# Patient Record
Sex: Female | Born: 1975
Health system: Southern US, Community
[De-identification: ages and names within clinical notes are randomized; demographics above are authoritative.]

---

## 2017-07-06 ENCOUNTER — Ambulatory Visit (INDEPENDENT_AMBULATORY_CARE_PROVIDER_SITE_OTHER): Payer: 59 | Admitting: Family Medicine

## 2017-07-06 ENCOUNTER — Encounter: Payer: Self-pay | Admitting: Family Medicine

## 2017-07-06 VITALS — BP 112/62 | HR 104 | Temp 98.0°F | Ht 65.5 in | Wt 132.0 lb

## 2017-07-06 DIAGNOSIS — Z13220 Encounter for screening for lipoid disorders: Secondary | ICD-10-CM | POA: Diagnosis not present

## 2017-07-06 DIAGNOSIS — Z0000 Encounter for general adult medical examination without abnormal findings: Secondary | ICD-10-CM | POA: Diagnosis not present

## 2017-07-06 LAB — LIPID PANEL
CHOL/HDL RATIO: 2
Cholesterol: 132 mg/dL (ref 0–200)
HDL: 61.5 mg/dL (ref >39.00)
LDL CALC: 64 mg/dL (ref 0–99)
NonHDL: 70.6
TRIGLYCERIDES: 32 mg/dL (ref 0.0–149.0)
VLDL: 6.4 mg/dL (ref 0.0–40.0)

## 2017-07-06 LAB — BASIC METABOLIC PANEL
BUN: 17 mg/dL (ref 6–23)
CALCIUM: 9.7 mg/dL (ref 8.4–10.5)
CO2: 31 mEq/L (ref 19–32)
Chloride: 102 mEq/L (ref 96–112)
Creatinine, Ser: 0.63 mg/dL (ref 0.40–1.20)
GFR: 110.28 mL/min (ref >60.00)
GLUCOSE: 91 mg/dL (ref 70–99)
POTASSIUM: 3.7 meq/L (ref 3.5–5.1)
SODIUM: 139 meq/L (ref 135–145)

## 2017-07-06 LAB — CBC
HEMATOCRIT: 39 % (ref 36.0–46.0)
Hemoglobin: 12.9 g/dL (ref 12.0–15.0)
MCHC: 33.2 g/dL (ref 30.0–36.0)
MCV: 88.1 fl (ref 78.0–100.0)
Platelets: 220 10*3/uL (ref 150.0–400.0)
RBC: 4.42 Mil/uL (ref 3.87–5.11)
RDW: 13 % (ref 11.5–15.5)
WBC: 5.8 10*3/uL (ref 4.0–10.5)

## 2017-07-06 NOTE — Patient Instructions (Addendum)
Preventive Care 40-64 Years, Female Preventive care refers to lifestyle choices and visits with your health care provider that can promote health and wellness. What does preventive care include?  A yearly physical exam. This is also called an annual well check.  Dental exams once or twice a year.  Routine eye exams. Ask your health care provider how often you should have your eyes checked.  Personal lifestyle choices, including: ? Daily care of your teeth and gums. ? Regular physical activity. ? Eating a healthy diet. ? Avoiding tobacco and drug use. ? Limiting alcohol use. ? Practicing safe sex. ? Taking low-dose aspirin daily starting at age 58. ? Taking vitamin and mineral supplements as recommended by your health care provider. What happens during an annual well check? The services and screenings done by your health care provider during your annual well check will depend on your age, overall health, lifestyle risk factors, and family history of disease. Counseling Your health care provider may ask you questions about your:  Alcohol use.  Tobacco use.  Drug use.  Emotional well-being.  Home and relationship well-being.  Sexual activity.  Eating habits.  Work and work Statistician.  Method of birth control.  Menstrual cycle.  Pregnancy history.  Screening You may have the following tests or measurements:  Height, weight, and BMI.  Blood pressure.  Lipid and cholesterol levels. These may be checked every 5 years, or more frequently if you are over 81 years old.  Skin check.  Lung cancer screening. You may have this screening every year starting at age 78 if you have a 30-pack-year history of smoking and currently smoke or have quit within the past 15 years.  Fecal occult blood test (FOBT) of the stool. You may have this test every year starting at age 65.  Flexible sigmoidoscopy or colonoscopy. You may have a sigmoidoscopy every 5 years or a colonoscopy  every 10 years starting at age 30.  Hepatitis C blood test.  Hepatitis B blood test.  Sexually transmitted disease (STD) testing.  Diabetes screening. This is done by checking your blood sugar (glucose) after you have not eaten for a while (fasting). You may have this done every 1-3 years.  Mammogram. This may be done every 1-2 years. Talk to your health care provider about when you should start having regular mammograms. This may depend on whether you have a family history of breast cancer.  BRCA-related cancer screening. This may be done if you have a family history of breast, ovarian, tubal, or peritoneal cancers.  Pelvic exam and Pap test. This may be done every 3 years starting at age 80. Starting at age 36, this may be done every 5 years if you have a Pap test in combination with an HPV test.  Bone density scan. This is done to screen for osteoporosis. You may have this scan if you are at high risk for osteoporosis.  Discuss your test results, treatment options, and if necessary, the need for more tests with your health care provider. Vaccines Your health care provider may recommend certain vaccines, such as:  Influenza vaccine. This is recommended every year.  Tetanus, diphtheria, and acellular pertussis (Tdap, Td) vaccine. You may need a Td booster every 10 years.  Varicella vaccine. You may need this if you have not been vaccinated.  Zoster vaccine. You may need this after age 5.  Measles, mumps, and rubella (MMR) vaccine. You may need at least one dose of MMR if you were born in  1957 or later. You may also need a second dose.  Pneumococcal 13-valent conjugate (PCV13) vaccine. You may need this if you have certain conditions and were not previously vaccinated.  Pneumococcal polysaccharide (PPSV23) vaccine. You may need one or two doses if you smoke cigarettes or if you have certain conditions.  Meningococcal vaccine. You may need this if you have certain  conditions.  Hepatitis A vaccine. You may need this if you have certain conditions or if you travel or work in places where you may be exposed to hepatitis A.  Hepatitis B vaccine. You may need this if you have certain conditions or if you travel or work in places where you may be exposed to hepatitis B.  Haemophilus influenzae type b (Hib) vaccine. You may need this if you have certain conditions.  Talk to your health care provider about which screenings and vaccines you need and how often you need them. This information is not intended to replace advice given to you by your health care provider. Make sure you discuss any questions you have with your health care provider. Document Released: 05/21/2015 Document Revised: 01/12/2016 Document Reviewed: 02/23/2015 Elsevier Interactive Patient Education  2018 [employer]  

## 2017-07-06 NOTE — Progress Notes (Signed)
Patient presents to clinic today to establish care.  SUBJECTIVE: PMH: Patient is a 42 year old female with no significant past medical history.  She was previously seen in [place] by Dr. Nile RiggsShapiro  Family planning: -Patient has a Mirena IUD in place. -It is due to be removed in 2021 -Patient has 1 daughter who is 2-1/42 yo.  Patient denies any issues during her pregnancy.  She states she did not even have nausea/vomiting. -Pt and her husband are considering having a second child. 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  Patient is interested in planning/catering/decorating.  Allergies: NKDA  Past surgical history: None  Social history: Patient is married.  She has a 372-1/22-year-old daughter.  Patient states her family relocated to the area for her husband's job.  Patient denies tobacco, alcohol, drug use.  Family medical history: Mom-hypertension Dad-heart problems had open heart surgery in 2014 Aunt-breast cancer in her 30s  Health Maintenance: Mammogram --due PAP --within the last few months (late 2018/early 2019)   History reviewed. No pertinent past medical history.  History reviewed. No pertinent surgical history.  No current outpatient medications on file prior to visit.   No current facility-administered medications on file prior to visit.     No Known Allergies  History reviewed. No pertinent family history.  Social History   Socioeconomic History  . Marital status: Married    Spouse name: Not on file  . Number of children: Not on file  . Years of education: Not on file  . Highest education level: Not on file  Social Needs  . Financial resource strain: Not on file  . Food insecurity - worry: Not on file  . Food insecurity - inability: Not on file  . Transportation needs - medical: Not on file  . Transportation needs - non-medical: Not on file  Occupational History  . Not on file  Tobacco Use  .  Smoking status: Not on file  Substance and Sexual Activity  . Alcohol use: Not on file  . Drug use: Not on file  . Sexual activity: Not on file  Other Topics Concern  . Not on file  Social History Narrative  . Not on file    ROS General: Denies fever, chills, night sweats, changes in weight, changes in appetite HEENT: Denies headaches, ear pain, changes in vision, rhinorrhea, sore throat CV: Denies CP, palpitations, SOB, orthopnea Pulm: Denies SOB, cough, wheezing GI: Denies abdominal pain, nausea, vomiting, diarrhea, constipation GU: Denies dysuria, hematuria, frequency, vaginal discharge Msk: Denies muscle cramps, joint pains Neuro: Denies weakness, numbness, tingling Skin: Denies rashes, bruising Psych: Denies depression, anxiety, hallucinations  BP 112/62 (BP Location: Left Arm, Patient Position: Sitting, Cuff Size: Normal)   Pulse (!) 104   Temp 98 F (36.7 C) (Oral)   Ht 5' 5.5" (1.664 m)   Wt 132 lb (59.9 kg)   SpO2 96%   BMI 21.63 kg/m   Physical Exam Gen. Pleasant, well developed, well-nourished, in NAD HEENT - [place]/AT, no scleral icterus, no nasal drainage, pharynx without erythema or exudate. Neck: No JVD, no thyromegaly, no carotid bruits Lungs: no use of accessory muscles, CTAB, no wheezes, rales or rhonchi Cardiovascular: RRR, No r/g/m, no peripheral edema Abdomen: BS present, soft, nontender,nondistended, no hepatosplenomegaly Musculoskeletal: No deformities, moves all four extremities, no cyanosis or clubbing, normal tone Neuro:  A&Ox3, CN II-XII intact, normal gait Skin:  Warm, dry, intact, no lesions Psych: normal affect, mood appropriate  No results found for this or any previous visit (from the past 2160 hour(s)).  Assessment/Plan: Well adult exam  -Anticipatory guidance given including wearing seatbelts, smoke detectors in the home, staying physically fit/active, increasing p.o. intake of water and vegetables. -Patient given handout -Pap  up-to-date -Given handout to schedule mammogram -We will obtain labs this visit -Mirena IUD in place.  Discussed removal decides to become pregnant prior to removal date. - Plan: CBC (no diff), Basic metabolic panel -Next CPE in 1 year  Screening for cholesterol level  - Plan: Lipid panel  Follow-up PRN  Abbe Amsterdam, MD

## 2017-07-09 ENCOUNTER — Encounter: Payer: Self-pay | Admitting: *Deleted

## 2017-07-16 ENCOUNTER — Encounter: Payer: Self-pay | Admitting: Family Medicine

## 2017-07-16 ENCOUNTER — Ambulatory Visit (INDEPENDENT_AMBULATORY_CARE_PROVIDER_SITE_OTHER): Payer: 59 | Admitting: Family Medicine

## 2017-07-16 VITALS — BP 118/62 | HR 127 | Temp 101.0°F | Ht 65.5 in | Wt 133.0 lb

## 2017-07-16 DIAGNOSIS — R000 Tachycardia, unspecified: Secondary | ICD-10-CM

## 2017-07-16 DIAGNOSIS — R509 Fever, unspecified: Secondary | ICD-10-CM | POA: Diagnosis not present

## 2017-07-16 DIAGNOSIS — J101 Influenza due to other identified influenza virus with other respiratory manifestations: Secondary | ICD-10-CM

## 2017-07-16 LAB — POCT INFLUENZA A/B
INFLUENZA A, POC: POSITIVE — AB
INFLUENZA B, POC: NEGATIVE

## 2017-07-16 MED ORDER — OSELTAMIVIR PHOSPHATE 75 MG PO CAPS: 75.0000 mg | ORAL_CAPSULE | Freq: Two times a day (BID) | ORAL | 0 refills | Status: AC

## 2017-07-16 NOTE — Progress Notes (Signed)
Subjective:    Patient ID: Cindy Todd, female    DOB: 12/22/1975, 42 y.o.   MRN: [id number]  Chief Complaint  Patient presents with  . Fever    HPI Patient was seen today for acute concern.  Pt endorses feeling weak, not sleeping, fever, body aches, slight headache, emesis, and cough which started Sunday.  Pt denies sore throat.  Pt states she has taken Tylenol and Robitussin for her symptoms.  Sick contacts include patient's neighbor who was sick over the weekend.  Pt did not get a flu shot this year.  Pt states her 42-year-old daughter has also been feeling sick so she has been trying to stay away from her.  She is not currently nursing given her illness.  Pt's husband is now beginning to feel sick as well.  History reviewed. No pertinent past medical history.  No Known Allergies  ROS General: Denies chills, night sweats, changes in weight, changes in appetite    +fever, body aches, feeling weak, insomnia HEENT: Denies ear pain, changes in vision, sore throat  + headache, rhinorrhea CV: Denies CP, palpitations, SOB, orthopnea Pulm: Denies SOB, wheezing  + cough GI: Denies abdominal pain, nausea, diarrhea, constipation  + vomiting GU: Denies dysuria, hematuria, frequency, vaginal discharge Msk: Denies muscle cramps, joint pains Neuro: Denies weakness, numbness, tingling Skin: Denies rashes, bruising Psych: Denies depression, anxiety, hallucinations     Objective:    Blood pressure 118/62, pulse (!) 127, temperature (!) 101 F (38.3 C), temperature source Oral, height 5' 5.5" (1.664 m), weight 133 lb (60.3 kg), SpO2 95 %.   Gen. Pleasant, well-nourished, in no distress, normal affect   HEENT: /AT, face symmetric,  no scleral icterus, PERRLA, nares patent with clear drainage, pharynx with mild erythema, no exudate.  TMs normal b/l, no cervical lymphadenopathy. Lungs: no accessory muscle use, CTAB, no wheezes or rales Cardiovascular: RRR, no m/r/g, no peripheral  edema Abdomen: BS present, soft, NT/ND Neuro:  A&Ox3, CN II-XII intact, normal gait   Wt Readings from Last 3 Encounters:  07/16/17 133 lb (60.3 kg)  07/06/17 132 lb (59.9 kg)    Lab Results  Component Value Date   WBC 5.8 07/06/2017   HGB 12.9 07/06/2017   HCT 39.0 07/06/2017   PLT 220.0 07/06/2017   GLUCOSE 91 07/06/2017   CHOL 132 07/06/2017   TRIG 32.0 07/06/2017   HDL 61.50 07/06/2017   LDLCALC 64 07/06/2017   NA 139 07/06/2017   K 3.7 07/06/2017   CL 102 07/06/2017   CREATININE 0.63 07/06/2017   BUN 17 07/06/2017   CO2 31 07/06/2017    Assessment/Plan:  Influenza A  -Supportive care. -Discussed using Tamiflu given duration of symptoms less than 48 hours.  Discussed r/b/a. -Discussed frequent handwashing -Given handout - Plan: oseltamivir (TAMIFLU) 75 MG capsule  Fever, unspecified fever cause  - Plan: POCT Influenza A/B   positive  Tachycardia -Likely secondary to fever -Discussed staying hydrated, taking Tylenol as needed for aches, pains, and fever.  Given RTC or ED precautions.  Abbe AmsterdamShannon Dawson Hollman, MD

## 2017-07-16 NOTE — Patient Instructions (Addendum)
Remember to stay hydrated even if you do not feel like eating.  You can drink Gatorade, water, tea, juice, etc. It is okay to take Tylenol as needed for any aches, pains, or fever. I have sent in a prescription for Tamiflu to [facility] at the [facility] on [address] Influenza, Adult Influenza, more commonly known as "the flu," is a viral infection that primarily affects the respiratory tract. The respiratory tract includes organs that help you breathe, such as the lungs, nose, and throat. The flu causes many common cold symptoms, as well as a high fever and body aches. The flu spreads easily from person to person (is contagious). Getting a flu shot (influenza vaccination) every year is the best way to prevent influenza. What are the causes? Influenza is caused by a virus. You can catch the virus by:  Breathing in droplets from an infected person's cough or sneeze.  Touching something that was recently contaminated with the virus and then touching your mouth, nose, or eyes.  What increases the risk? The following factors may make you more likely to get the flu:  Not cleaning your hands frequently with soap and water or alcohol-based hand sanitizer.  Having close contact with many people during cold and flu season.  Touching your mouth, eyes, or nose without washing or sanitizing your hands first.  Not drinking enough fluids or not eating a healthy diet.  Not getting enough sleep or exercise.  Being under a high amount of stress.  Not getting a yearly (annual) flu shot.  You may be at a higher risk of complications from the flu, such as a severe lung infection (pneumonia), if you:  Are over the age of 42.  Are pregnant.  Have a weakened disease-fighting system (immune system). You may have a weakened immune system if you: ? Have HIV or AIDS. ? Are undergoing chemotherapy. ? Aretaking medicines that reduce the activity of (suppress) the immune system.  Have a long-term  (chronic) illness, such as heart disease, kidney disease, diabetes, or lung disease.  Have a liver disorder.  Are obese.  Have anemia.  What are the signs or symptoms? Symptoms of this condition typically last 4-10 days and may include:  Fever.  Chills.  Headache, body aches, or muscle aches.  Sore throat.  Cough.  Runny or congested nose.  Chest discomfort and cough.  Poor appetite.  Weakness or tiredness (fatigue).  Dizziness.  Nausea or vomiting.  How is this diagnosed? This condition may be diagnosed based on your medical history and a physical exam. Your health care provider may do a nose or throat swab test to confirm the diagnosis. How is this treated? If influenza is detected early, you can be treated with antiviral medicine that can reduce the length of your illness and the severity of your symptoms. This medicine may be given by mouth (orally) or through an IV tube that is inserted in one of your veins. The goal of treatment is to relieve symptoms by taking care of yourself at home. This may include taking over-the-counter medicines, drinking plenty of fluids, and adding humidity to the air in your home. In some cases, influenza goes away on its own. Severe influenza or complications from influenza may be treated in a hospital. Follow these instructions at home:  Take over-the-counter and prescription medicines only as told by your health care provider.  Use a cool mist humidifier to add humidity to the air in your home. This can make breathing easier.  Rest as needed.  Drink enough fluid to keep your urine clear or pale yellow.  Cover your mouth and nose when you cough or sneeze.  Wash your hands with soap and water often, especially after you cough or sneeze. If soap and water are not available, use hand sanitizer.  Stay home from work or school as told by your health care provider. Unless you are visiting your health care provider, try to avoid  leaving home until your fever has been gone for 24 hours without the use of medicine.  Keep all follow-up visits as told by your health care provider. This is important. How is this prevented?  Getting an annual flu shot is the best way to avoid getting the flu. You may get the flu shot in late summer, fall, or winter. Ask your health care provider when you should get your flu shot.  Wash your hands often or use hand sanitizer often.  Avoid contact with people who are sick during cold and flu season.  Eat a healthy diet, drink plenty of fluids, get enough sleep, and exercise regularly. Contact a health care provider if:  You develop new symptoms.  You have: ? Chest pain. ? Diarrhea. ? A fever.  Your cough gets worse.  You produce more mucus.  You feel nauseous or you vomit. Get help right away if:  You develop shortness of breath or difficulty breathing.  Your skin or nails turn a bluish color.  You have severe pain or stiffness in your neck.  You develop a sudden headache or sudden pain in your face or ear.  You cannot stop vomiting. This information is not intended to replace advice given to you by your health care provider. Make sure you discuss any questions you have with your health care provider. Document Released: 04/21/2000 Document Revised: 09/30/2015 Document Reviewed: 02/16/2015 Elsevier Interactive Patient Education  2017 [employer].

## 2017-07-27 ENCOUNTER — Encounter: Payer: Self-pay | Admitting: Family Medicine

## 2017-07-27 ENCOUNTER — Ambulatory Visit (INDEPENDENT_AMBULATORY_CARE_PROVIDER_SITE_OTHER): Payer: 59 | Admitting: Family Medicine

## 2017-07-27 VITALS — BP 100/80 | HR 84 | Temp 97.5°F | Wt 125.0 lb

## 2017-07-27 DIAGNOSIS — R05 Cough: Secondary | ICD-10-CM | POA: Diagnosis not present

## 2017-07-27 DIAGNOSIS — R0982 Postnasal drip: Secondary | ICD-10-CM

## 2017-07-27 DIAGNOSIS — R058 Other specified cough: Secondary | ICD-10-CM

## 2017-07-27 MED ORDER — FLUTICASONE PROPIONATE 50 MCG/ACT NA SUSP
1.0000 | Freq: Every day | NASAL | 0 refills | Status: DC
Start: 2017-07-27 — End: 2021-02-10

## 2017-07-27 MED ORDER — BENZONATATE 100 MG PO CAPS: 100.0000 mg | ORAL_CAPSULE | Freq: Two times a day (BID) | ORAL | 0 refills | Status: DC | PRN

## 2017-07-27 NOTE — Patient Instructions (Addendum)
Postnasal Drip Postnasal drip is the feeling of mucus going down the back of your throat. Mucus is a slimy substance that moistens and cleans your nose and throat, as well as the air pockets in face bones near your forehead and cheeks (sinuses). Small amounts of mucus pass from your nose and sinuses down the back of your throat all the time. This is normal. When you produce too much mucus or the mucus gets too thick, you can feel it. Some common causes of postnasal drip include:  Having more mucus because of: ? A cold or the flu. ? Allergies. ? Cold air. ? Certain medicines.  Having more mucus that is thicker because of: ? A sinus or nasal infection. ? Dry air. ? A food allergy.  Follow these instructions at home: Relieving discomfort  Gargle with a salt-water mixture 3-4 times a day or as needed. To make a salt-water mixture, completely dissolve -1 tsp of salt in 1 cup of warm water.  If the air in your home is dry, use a humidifier to add moisture to the air.  Use a saline spray or container (neti pot) to flush out the nose (nasal irrigation). These methods can help clear away mucus and keep the nasal passages moist. General instructions  Take over-the-counter and prescription medicines only as told by your health care provider.  Follow instructions from your health care provider about eating or drinking restrictions. You may need to avoid caffeine.  Avoid things that you know you are allergic to (allergens), like dust, mold, pollen, pets, or certain foods.  Drink enough fluid to keep your urine pale yellow.  Keep all follow-up visits as told by your health care provider. This is important. Contact a health care provider if:  You have a fever.  You have a sore throat.  You have difficulty swallowing.  You have headache.  You have sinus pain.  You have a cough that does not go away.  The mucus from your nose becomes thick and is green or yellow in color.  You have  cold or flu symptoms that last more than 10 days. Summary  Postnasal drip is the feeling of mucus going down the back of your throat.  If your health care provider approves, use nasal irrigation or a nasal spray 2?4 times a day.  Avoid things that you know you are allergic to (allergens), like dust, mold, pollen, pets, or certain foods. This information is not intended to replace advice given to you by your health care provider. Make sure you discuss any questions you have with your health care provider. Document Released: 08/07/2016 Document Revised: 08/07/2016 Document Reviewed: 08/07/2016 Elsevier Interactive Patient Education  2018 [employer].  Cough, Adult Coughing is a reflex that clears your throat and your airways. Coughing helps to heal and protect your lungs. It is normal to cough occasionally, but a cough that happens with other symptoms or lasts a long time may be a sign of a condition that needs treatment. A cough may last only 2-3 weeks (acute), or it may last longer than 8 weeks (chronic). What are the causes? Coughing is commonly caused by:  Breathing in substances that irritate your lungs.  A viral or bacterial respiratory infection.  Allergies.  Asthma.  Postnasal drip.  Smoking.  Acid backing up from the stomach into the esophagus (gastroesophageal reflux).  Certain medicines.  Chronic lung problems, including COPD (or rarely, lung cancer).  Other medical conditions such as heart failure.  Follow  these instructions at home: Pay attention to any changes in your symptoms. Take these actions to help with your discomfort:  Take medicines only as told by your health care provider. ? If you were prescribed an antibiotic medicine, take it as told by your health care provider. Do not stop taking the antibiotic even if you start to feel better. ? Talk with your health care provider before you take a cough suppressant medicine.  Drink enough fluid to keep your  urine clear or pale yellow.  If the air is dry, use a cold steam vaporizer or humidifier in your bedroom or your home to help loosen secretions.  Avoid anything that causes you to cough at work or at home.  If your cough is worse at night, try sleeping in a semi-upright position.  Avoid cigarette smoke. If you smoke, quit smoking. If you need help quitting, ask your health care provider.  Avoid caffeine.  Avoid alcohol.  Rest as needed.  Contact a health care provider if:  You have new symptoms.  You cough up pus.  Your cough does not get better after 2-3 weeks, or your cough gets worse.  You cannot control your cough with suppressant medicines and you are losing sleep.  You develop pain that is getting worse or pain that is not controlled with pain medicines.  You have a fever.  You have unexplained weight loss.  You have night sweats. Get help right away if:  You cough up blood.  You have difficulty breathing.  Your heartbeat is very fast. This information is not intended to replace advice given to you by your health care provider. Make sure you discuss any questions you have with your health care provider. Document Released: 10/21/2010 Document Revised: 09/30/2015 Document Reviewed: 07/01/2014 Elsevier Interactive Patient Education  2018 [employer] Weaning When you stop breastfeeding, it is called weaning. This can be a natural process that takes place on its own over time. There may be a reason you need to stop breastfeeding before it can happen naturally (such as you may need to go back to work, be away from home on a trip, or you feel it is the right time). If possible, wait until your baby is at least 276 months old. With a little time and preparation, weaning can be a positive experience. When is the best time to stop breastfeeding? There is no right or wrong answer. What is best for you and your child may be different from what is best for other mothers and  their children. It is recommended to:  Feed your baby only breast milk for the first 6 months.  Feed your baby both breast milk and solid food for another 6 months.  Continue to breastfeed your baby as long as both you and your child desire after 12 months.  How do I start weaning? Wean gradually over several weeks. Some guidelines to follow are:  Start by introducing iron-fortified, solid food in addition to breast milk.  Encourage your baby to try different feeding methods.  Teach your baby to drink from a cup. Try putting pre-pumped (expressed) breast milk in the cup.  If your baby will not use a cup, try offering a bottle.  It may be easier and less confusing for your baby if someone else offers the first cup or bottle feeding.  When cup or bottle feeding is successful, and your baby is getting enough nutrition that way, you can substitute a cup feeding for a breastfeeding  session.  You can eventually substitute a cup feeding for another breastfeeding session, especially if your baby will drink something besides your expressed breast milk.  Continue replacing one more breastfeeding session every few days.  Your breasts may feel full and uncomfortable at times during weaning. Express just a small amount of milk for relief.  How does breastfeeding stop naturally? Children may start to wean themselves at about 6 months. This may happen when:  You introduce solid food. Your baby may still prefer to nurse in order to get fluids.  Your baby is better able to drink from a cup. This may prompt your baby to breastfeed less often.  Your baby gradually becomes less interested in breastfeeding as he or she gets used to drinking other fluids.  Your baby slowly starts to drop one breastfeeding session every 2-3 days.  When should I get help with weaning? Your health care provider or a lactation consultant can help you during the weaning process. They are good resources to ask which  foods are best to introduce first and which fluids you can start substituting for breast milk. They can also help if you are having any problems related to weaning. When should I contact my health care provider? You should contact your health care provider if:  Your baby is not gaining weight.  You baby suddenly stops nursing.  One or both breasts become firm and painful.  This information is not intended to replace advice given to you by your health care provider. Make sure you discuss any questions you have with your health care provider. Document Released: 08/23/2004 Document Revised: 09/30/2015 Document Reviewed: 02/18/2013 Elsevier Interactive Patient Education  [employer].

## 2017-07-27 NOTE — Progress Notes (Signed)
Subjective:    Patient ID: Cindy Todd, female    DOB: 06/10/1975, 42 y.o.   MRN: [id number]  No chief complaint on file.   HPI Patient was seen today for ongoing concern.  Pt endorses feeling better since last OFV where she was diagnosed with influenza A on 07/16/17.  Pt states she still has a lingering nonproductive cough.  Pt endorses soreness in her chest with coughing.  Pt denies fever, rhinorrhea, headache, sore throat.  Pt also states she is trying to stop breast-feeding.  Pt's daughter is now 42 years old.  She is also trying to get her daughter to sleep in her own bed.  Patient states this has proved to be a challenge.  History reviewed. No pertinent past medical history.  No Known Allergies  ROS General: Denies fever, chills, night sweats, changes in weight, changes in appetite HEENT: Denies headaches, ear pain, changes in vision, rhinorrhea, sore throat CV: Denies CP, palpitations, SOB, orthopnea Pulm: Denies SOB, wheezing  +cough GI: Denies abdominal pain, nausea, vomiting, diarrhea, constipation GU: Denies dysuria, hematuria, frequency, vaginal discharge Msk: Denies muscle cramps, joint pains  +chest soreness with coughing. Neuro: Denies weakness, numbness, tingling Skin: Denies rashes, bruising Psych: Denies depression, anxiety, hallucinations     Objective:    Blood pressure 100/80, pulse 84, temperature (!) 97.5 F (36.4 C), temperature source Oral, weight 125 lb (56.7 kg), SpO2 98 %.   Gen. Pleasant, well-nourished, in no distress, normal affect   HEENT: [place]/AT, face symmetric, no scleral icterus, PERRLA, nares patent without drainage, pharynx without erythema or exudate, postnasal drainage present.  TMs full bilaterally.  No cervical lymphadenopathy. Lungs: no accessory muscle use, CTAB, no wheezes or rales Cardiovascular: RRR, no m/r/g, no peripheral edema Abdomen: BS present, soft, NT/ND Musculoskeletal: No deformities, no cyanosis or clubbing,  normal tone TTP of the left lateral chest and mid axillary line Neuro:  A&Ox3, CN II-XII intact, normal gait    Wt Readings from Last 3 Encounters:  07/27/17 125 lb (56.7 kg)  07/16/17 133 lb (60.3 kg)  07/06/17 132 lb (59.9 kg)    Lab Results  Component Value Date   WBC 5.8 07/06/2017   HGB 12.9 07/06/2017   HCT 39.0 07/06/2017   PLT 220.0 07/06/2017   GLUCOSE 91 07/06/2017   CHOL 132 07/06/2017   TRIG 32.0 07/06/2017   HDL 61.50 07/06/2017   LDLCALC 64 07/06/2017   NA 139 07/06/2017   K 3.7 07/06/2017   CL 102 07/06/2017   CREATININE 0.63 07/06/2017   BUN 17 07/06/2017   CO2 31 07/06/2017    Assessment/Plan:  Post-nasal drainage -Given handout - Plan: fluticasone (FLONASE) 50 MCG/ACT nasal spray  Post-viral cough syndrome -Patient given handout -Patient advised cough after viral syndrome can last several weeks. - Plan: benzonatate (TESSALON) 100 MG capsule  Follow-up PRN  Abbe AmsterdamShannon Dejae Bernet, MD

## 2018-01-04 ENCOUNTER — Encounter: Payer: Self-pay | Admitting: Family Medicine

## 2018-01-04 ENCOUNTER — Ambulatory Visit (INDEPENDENT_AMBULATORY_CARE_PROVIDER_SITE_OTHER): Payer: 59 | Admitting: Family Medicine

## 2018-01-04 VITALS — BP 122/60 | HR 114 | Temp 98.3°F | Wt 129.2 lb

## 2018-01-04 DIAGNOSIS — L255 Unspecified contact dermatitis due to plants, except food: Secondary | ICD-10-CM | POA: Diagnosis not present

## 2018-01-04 MED ORDER — TRIAMCINOLONE ACETONIDE 0.1 % EX CREA: 1.0000 "application " | TOPICAL_CREAM | Freq: Two times a day (BID) | CUTANEOUS | 0 refills | Status: DC | PRN

## 2018-01-04 NOTE — Progress Notes (Signed)
  Cindy HubertMeriann Yesenia Diaz-Gomez DOB: 12/19/1975 Encounter date: 01/04/2018  This is a 42 y.o. female who presents with Chief Complaint  Patient presents with  . Rash    both arms, x 4 days, itches, painful,     History of present illness:  Working in new yard and exposed to poison ivy. Has been using otc gold bond and sarna which helps with itch. Still bothering her. Started 4 days ago. At this point hasn't noticed any progression of rash. Mainly on arms, but some around ankles as well.     No Known Allergies Current Meds  Medication Sig  . benzonatate (TESSALON) 100 MG capsule Take 1 capsule (100 mg total) by mouth 2 (two) times daily as needed for cough.  . fluticasone (FLONASE) 50 MCG/ACT nasal spray Place 1 spray into both nostrils daily.    Review of Systems  Constitutional: Negative for chills and fever.  Skin: Positive for rash (see hpi).    Objective:  BP 122/60 (BP Location: Left Arm, Patient Position: Sitting, Cuff Size: Normal)   Pulse (!) 114   Temp 98.3 F (36.8 C) (Oral)   Wt 129 lb 3.2 oz (58.6 kg)   BMI 21.17 kg/m   Weight: 129 lb 3.2 oz (58.6 kg)   BP Readings from Last 3 Encounters:  01/04/18 122/60  07/27/17 100/80  07/16/17 118/62   Wt Readings from Last 3 Encounters:  01/04/18 129 lb 3.2 oz (58.6 kg)  07/27/17 125 lb (56.7 kg)  07/16/17 133 lb (60.3 kg)    Physical Exam  Constitutional: She appears well-developed and well-nourished. No distress.  Pulmonary/Chest: Effort normal.  Skin:  Vesicular rash in streaks up forearms; scattered around ankles.     Assessment/Plan 1. Plant dermatitis Apply triamcinolone as needed for itching/rash. Let us know if worsening of sx.  - triamcinolone cream (KENALOG) 0.1 %; Apply 1 application topically 2 (two) times daily as needed. Limit use to 2 weeks or less  Dispense: 15 g; Refill: 0  Return if symptoms worsen or fail to improve.    Theodis ShoveJunell Chelsia Serres, MD

## 2018-02-05 ENCOUNTER — Encounter: Payer: 59 | Admitting: Family Medicine

## 2018-02-05 ENCOUNTER — Encounter: Payer: Self-pay | Admitting: Family Medicine

## 2018-02-05 NOTE — Progress Notes (Signed)
Error.    Pt came in for pap, however had a pap Dec 2018/Jan 2019.  Mirena in place (to be removed 2021).  Last CPE was March 2019.   Influenza vaccine given Sept 2019 at pharmacy.   Of note pt and family moved into new house.  F/u prn  Abbe Amsterdam, MD

## 2018-05-16 ENCOUNTER — Ambulatory Visit (INDEPENDENT_AMBULATORY_CARE_PROVIDER_SITE_OTHER): Payer: No Typology Code available for payment source | Admitting: Family Medicine

## 2018-05-16 ENCOUNTER — Encounter: Payer: Self-pay | Admitting: Family Medicine

## 2018-05-16 VITALS — BP 102/74 | HR 80 | Temp 98.4°F | Wt 135.0 lb

## 2018-05-16 DIAGNOSIS — J018 Other acute sinusitis: Secondary | ICD-10-CM | POA: Diagnosis not present

## 2018-05-16 MED ORDER — AMOXICILLIN 500 MG PO CAPS: 500.0000 mg | ORAL_CAPSULE | Freq: Two times a day (BID) | ORAL | 0 refills | Status: AC

## 2018-05-16 NOTE — Progress Notes (Signed)
Subjective:    Patient ID: Cindy Todd, female    DOB: 1975/08/24, 43 y.o.   MRN: [id number]  No chief complaint on file.   HPI Patient was seen today for acute concern.  Pt dealing with cold symptoms times a few weeks.  Pt states she initially became sick with a cold, runny nose, headache, congestion which improved but then returned.  Pt endorses ongoing nasal congestion and pressure in her face.  Has taken Tylenol.  Sick contacts include pt's daughter who is in daycare.  Pt denies cough, sore throat, fever.  pt concerned about her ongoing illness as she is going to [place] to help take care of her sister who was recently dx'd with breast cancer.  Pt sister will be undergoing a double mastectomy.  influenza vaccine up-to-date History reviewed. No pertinent past medical history.  No Known Allergies  ROS General: Denies fever, chills, night sweats, changes in weight, changes in appetite HEENT: Denies headaches, ear pain, changes in vision, rhinorrhea, sore throat  +nasal congestion, facial pressure.   CV: Denies CP, palpitations, SOB, orthopnea Pulm: Denies SOB, cough, wheezing GI: Denies abdominal pain, nausea, vomiting, diarrhea, constipation GU: Denies dysuria, hematuria, frequency, vaginal discharge Msk: Denies muscle cramps, joint pains Neuro: Denies weakness, numbness, tingling Skin: Denies rashes, bruising Psych: Denies depression, anxiety, hallucinations    Objective:    Blood pressure 102/74, pulse 80, temperature 98.4 F (36.9 C), temperature source Oral, weight 135 lb (61.2 kg), SpO2 98 %.  Gen. Pleasant, well-nourished, in no distress, normal affect  HEENT: [place]/AT, face symmetric, no scleral icterus, PERRLA,  nares patent without drainage, pharynx without erythema or exudate. TMs full b/l.  No cervical lymphadenopathy. Lungs: no accessory muscle use, CTAB, no wheezes or rales Cardiovascular: RRR, no m/r/g, no peripheral edema Neuro:  A&Ox3, CN II-XII intact,  normal gait  Wt Readings from Last 3 Encounters:  05/16/18 135 lb (61.2 kg)  02/05/18 131 lb (59.4 kg)  01/04/18 129 lb 3.2 oz (58.6 kg)    Lab Results  Component Value Date   WBC 5.8 07/06/2017   HGB 12.9 07/06/2017   HCT 39.0 07/06/2017   PLT 220.0 07/06/2017   GLUCOSE 91 07/06/2017   CHOL 132 07/06/2017   TRIG 32.0 07/06/2017   HDL 61.50 07/06/2017   LDLCALC 64 07/06/2017   NA 139 07/06/2017   K 3.7 07/06/2017   CL 102 07/06/2017   CREATININE 0.63 07/06/2017   BUN 17 07/06/2017   CO2 31 07/06/2017    Assessment/Plan:  Acute non-recurrent sinusitis of other sinus  -Given handout -Okay to continue Tylenol PRN for pain/discomfort -Patient encouraged to use Flonase - Plan: amoxicillin (AMOXIL) 500 MG capsule  Follow-up PRN  Abbe Amsterdam, MD

## 2018-05-16 NOTE — Patient Instructions (Signed)
Sinusitis, en adultos  Sinusitis, Adult  La sinusitis es la inflamacin de los senos paranasales. Los senos paranasales son espacios vacos en los huesos alrededor del rostro. Los senos paranasales se encuentran en estos lugares:   Alrededor de los ojos.   En la mitad de la frente.   Detrs de la nariz.   En los pmulos.  Normalmente, la mucosidad drena a travs de los senos. Cuando los tejidos nasales se inflaman o hinchan, la mucosidad puede quedar atrapada o bloqueada. Esto fomenta la proliferacin de bacterias, virus y hongos, lo que produce infecciones. La mayora de las infecciones de los senos paranasales son provocadas por un virus.  La sinusitis puede desarrollarse rpidamente. Puede durar hasta 4semanas (aguda) o ms de 12semanas (crnica). A menudo, la sinusitis surge despus de un resfriado.  Cules son las causas?  Esta afeccin es causada por cualquier sustancia que inflame los senos o evite que la mucosidad drene. Esto puede comprender lo siguiente:   Alergias.   Asma.   Infeccin por bacterias o virus.   Deformidades u obstrucciones en la nariz o los senos paranasales.   Crecimientos anormales en la nariz (plipos nasales).   Agentes contaminantes, como sustancias qumicas o irritantes presentes en el aire.   Infeccin por hongos (poco frecuentes).  Qu incrementa el riesgo?  Es ms probable que tenga esta afeccin si:   Tienen debilitado el sistema de defensa del organismo (sistema inmunitario).   Nada o bucea mucho.   Abusa de los aerosoles nasales.   Fuma.  Cules son los signos o los sntomas?  Los principales sntomas de esta afeccin son dolor y sensacin de presin alrededor de los senos paranasales afectados. Otros sntomas pueden incluir los siguientes:   Nariz tapada o congestin nasal.   Drenaje de mucosidad espesa que sale de la nariz.   Hinchazn y calor en los senos paranasales afectados.   Dolor de cabeza.   Dolor en los dientes superiores.   Tos que puede  empeorar por la noche.   Mucosidad excesiva que se acumula en la garganta o la parte posterior de la nariz (goteo posnasal).   Disminucin del sentido del olfato y del gusto.   Fatiga.   Fiebre.   Dolor de garganta.   Mal aliento.  Cmo se diagnostica?  Esta afeccin se diagnostica en funcin de lo siguiente:   Sus sntomas.   Sus antecedentes mdicos.   Un examen fsico.   Pruebas para averiguar si la afeccin es aguda o crnica. Estas pueden incluir:  ? Revisar la nariz para ver si tiene plipos nasales.  ? Observar los senos paranasales con un dispositivo que tiene una luz (endoscopio).  ? Hacer pruebas para detectar alergias o bacterias.  ? Pruebas de diagnstico por imgenes, como resonancia magntica (RM) o una exploracin por tomografa computarizada (TC).  En contadas ocasiones, se puede realizar una biopsia de hueso para descartar tipos ms graves de infecciones por hongos en los senos paranasales.  Cmo se trata?  El tratamiento para la sinusitis depende de la causa y de si la afeccin es crnica o aguda.   Si la causa es un virus, los sntomas deberan desaparecer solos en el trmino de 10das. Pueden darle medicamentos para aliviar los sntomas. Entre ellos, se incluyen los siguientes:  ? Medicamentos para encoger las fosas nasales hinchadas (descongestivos intranasales tpicos).  ? Medicamentos para tratar alergias (antihistamnicos).  ? Un aerosol que alivia la inflamacin de las fosas nasales (corticoesteroide intranasal tpico).  ? Enjuagues que   ayudan a eliminar la mucosidad espesa de la nariz (lavados con solucin salina nasal).   Si la causa son bacterias, el mdico puede recomendarle que espere para ver si los sntomas mejoran. La mayora de las infecciones bacterianas mejoran sin medicamentos antibiticos. Posiblemente le den antibiticos si usted tiene:  ? Una infeccin grave.  ? El sistema inmunitario debilitado.   Si la causa es un estrechamiento de las fosas nasales o la  presencia de plipos nasales, es posible que necesite una ciruga.  Siga estas indicaciones en su casa:  Medicamentos   Tome, use o aplquese los medicamentos de venta libre y recetados solamente como se lo haya indicado el mdico. Estos pueden incluir aerosoles nasales.   Si le recetaron un antibitico, tmelo como se lo haya indicado el mdico. No deje de tomar los antibiticos aunque comience a sentirse mejor.  Hidrtese y humidifique los ambientes     Beba suficiente lquido como para mantener la orina de color amarillo plido. Mantenerse hidratado lo ayudar a diluir la mucosidad.   Use un humidificador de vapor fro para mantener la humedad de su hogar por encima del 50%.   Realice inhalaciones de vapor por 10 a 15minutos, de 3 a 4veces al da, o como se lo haya indicado el mdico. Puede hacer esto en el bao con el vapor del agua caliente de la ducha.   Limite la exposicin al aire fro o seco.  Reposo   Descanse todo lo que pueda.   Duerma con la cabeza levantada (elevada).   Asegrese de dormir lo suficiente cada noche.  Indicaciones generales     Aplquese un pao tibio y hmedo en la cara 3 o 4veces al da o como se lo haya indicado el mdico. Esto ayuda a calmar las molestias.   Lvese las manos frecuentemente con agua y jabn para reducir la exposicin a los grmenes. Use desinfectante para manos si no dispone de agua y jabn.   No fume. Evite estar cerca de personas que fuman (fumador pasivo).   Concurra a todas las visitas de seguimiento como se lo haya indicado el mdico. Esto es importante.  Comunquese con un mdico si:   Tiene fiebre.   Sus sntomas empeoran.   Los sntomas no mejoran en el trmino de 10das.  Solicite ayuda inmediatamente si:   Tiene un dolor de cabeza intenso.   Tiene vmitos persistentes.   Tiene dolor intenso o hinchazn en la zona del rostro o los ojos.   Tiene problemas de visin.   Presenta confusin.   Tiene el cuello rgido.   Tiene dificultad  para respirar.  Resumen   La sinusitis es el dolor y la inflamacin de los senos paranasales. Los senos paranasales son espacios vacos en los huesos alrededor del rostro.   La causa de esta afeccin es la inflamacin o hinchazn de los tejidos nasales. La hinchazn atrapa u obstruye el flujo de la mucosidad. Esto fomenta la proliferacin de bacterias, virus y hongos, lo que produce infecciones.   Si le recetaron un antibitico, tmelo como se lo haya indicado el mdico. No deje de tomar los antibiticos, aunque comience a sentirse mejor.   Concurra a todas las visitas de seguimiento como se lo haya indicado el mdico. Esto es importante.  Esta informacin no tiene como fin reemplazar el consejo del mdico. Asegrese de hacerle al mdico cualquier pregunta que tenga.  Document Released: 02/01/2005 Document Revised: 11/06/2017 Document Reviewed: 11/06/2017  Elsevier Interactive Patient Education  2019 [employer]

## 2018-06-06 ENCOUNTER — Encounter: Payer: No Typology Code available for payment source | Admitting: Family Medicine

## 2018-06-13 ENCOUNTER — Ambulatory Visit (INDEPENDENT_AMBULATORY_CARE_PROVIDER_SITE_OTHER): Payer: No Typology Code available for payment source | Admitting: Family Medicine

## 2018-06-13 ENCOUNTER — Encounter: Payer: Self-pay | Admitting: Family Medicine

## 2018-06-13 VITALS — BP 98/70 | HR 70 | Temp 98.7°F | Wt 137.0 lb

## 2018-06-13 DIAGNOSIS — Z0000 Encounter for general adult medical examination without abnormal findings: Secondary | ICD-10-CM | POA: Diagnosis not present

## 2018-06-13 DIAGNOSIS — Z13220 Encounter for screening for lipoid disorders: Secondary | ICD-10-CM

## 2018-06-13 DIAGNOSIS — Z131 Encounter for screening for diabetes mellitus: Secondary | ICD-10-CM

## 2018-06-13 LAB — CBC WITH DIFFERENTIAL/PLATELET
Basophils Absolute: 0 10*3/uL (ref 0.0–0.1)
Basophils Relative: 0.5 % (ref 0.0–3.0)
Eosinophils Absolute: 0.3 10*3/uL (ref 0.0–0.7)
Eosinophils Relative: 7.7 % — ABNORMAL HIGH (ref 0.0–5.0)
HCT: 38.1 % (ref 36.0–46.0)
HEMOGLOBIN: 12.8 g/dL (ref 12.0–15.0)
LYMPHS PCT: 40 % (ref 12.0–46.0)
Lymphs Abs: 1.5 10*3/uL (ref 0.7–4.0)
MCHC: 33.6 g/dL (ref 30.0–36.0)
MCV: 87.1 fl (ref 78.0–100.0)
MONO ABS: 0.2 10*3/uL (ref 0.1–1.0)
MONOS PCT: 6.3 % (ref 3.0–12.0)
Neutro Abs: 1.7 10*3/uL (ref 1.4–7.7)
Neutrophils Relative %: 45.5 % (ref 43.0–77.0)
Platelets: 190 10*3/uL (ref 150.0–400.0)
RBC: 4.37 Mil/uL (ref 3.87–5.11)
RDW: 13.3 % (ref 11.5–15.5)
WBC: 3.7 10*3/uL — ABNORMAL LOW (ref 4.0–10.5)

## 2018-06-13 LAB — BASIC METABOLIC PANEL
BUN: 14 mg/dL (ref 6–23)
CO2: 33 mEq/L — ABNORMAL HIGH (ref 19–32)
Calcium: 9.2 mg/dL (ref 8.4–10.5)
Chloride: 102 mEq/L (ref 96–112)
Creatinine, Ser: 0.61 mg/dL (ref 0.40–1.20)
GFR: 107.21 mL/min (ref >60.00)
Glucose, Bld: 84 mg/dL (ref 70–99)
Potassium: 4 mEq/L (ref 3.5–5.1)
Sodium: 139 mEq/L (ref 135–145)

## 2018-06-13 LAB — LIPID PANEL
Cholesterol: 151 mg/dL (ref 0–200)
HDL: 50.4 mg/dL (ref >39.00)
LDL Cholesterol: 86 mg/dL (ref 0–99)
NonHDL: 100.91
Total CHOL/HDL Ratio: 3
Triglycerides: 73 mg/dL (ref 0.0–149.0)
VLDL: 14.6 mg/dL (ref 0.0–40.0)

## 2018-06-13 LAB — HEMOGLOBIN A1C: HEMOGLOBIN A1C: 5.6 % (ref 4.6–6.5)

## 2018-06-13 NOTE — Patient Instructions (Signed)
Preventive Care 40-64 Years, Female Preventive care refers to lifestyle choices and visits with your health care provider that can promote health and wellness. What does preventive care include?   A yearly physical exam. This is also called an annual well check.  Dental exams once or twice a year.  Routine eye exams. Ask your health care provider how often you should have your eyes checked.  Personal lifestyle choices, including: ? Daily care of your teeth and gums. ? Regular physical activity. ? Eating a healthy diet. ? Avoiding tobacco and drug use. ? Limiting alcohol use. ? Practicing safe sex. ? Taking low-dose aspirin daily starting at age 50. ? Taking vitamin and mineral supplements as recommended by your health care provider. What happens during an annual well check? The services and screenings done by your health care provider during your annual well check will depend on your age, overall health, lifestyle risk factors, and family history of disease. Counseling Your health care provider may ask you questions about your:  Alcohol use.  Tobacco use.  Drug use.  Emotional well-being.  Home and relationship well-being.  Sexual activity.  Eating habits.  Work and work environment.  Method of birth control.  Menstrual cycle.  Pregnancy history. Screening You may have the following tests or measurements:  Height, weight, and BMI.  Blood pressure.  Lipid and cholesterol levels. These may be checked every 5 years, or more frequently if you are over 50 years old.  Skin check.  Lung cancer screening. You may have this screening every year starting at age 55 if you have a 30-pack-year history of smoking and currently smoke or have quit within the past 15 years.  Colorectal cancer screening. All adults should have this screening starting at age 50 and continuing until age 75. Your health care provider may recommend screening at age 45. You will have tests every  1-10 years, depending on your results and the type of screening test. People at increased risk should start screening at an earlier age. Screening tests may include: ? Guaiac-based fecal occult blood testing. ? Fecal immunochemical test (FIT). ? Stool DNA test. ? Virtual colonoscopy. ? Sigmoidoscopy. During this test, a flexible tube with a tiny camera (sigmoidoscope) is used to examine your rectum and lower colon. The sigmoidoscope is inserted through your anus into your rectum and lower colon. ? Colonoscopy. During this test, a long, thin, flexible tube with a tiny camera (colonoscope) is used to examine your entire colon and rectum.  Hepatitis C blood test.  Hepatitis B blood test.  Sexually transmitted disease (STD) testing.  Diabetes screening. This is done by checking your blood sugar (glucose) after you have not eaten for a while (fasting). You may have this done every 1-3 years.  Mammogram. This may be done every 1-2 years. Talk to your health care provider about when you should start having regular mammograms. This may depend on whether you have a family history of breast cancer.  BRCA-related cancer screening. This may be done if you have a family history of breast, ovarian, tubal, or peritoneal cancers.  Pelvic exam and Pap test. This may be done every 3 years starting at age 21. Starting at age 30, this may be done every 5 years if you have a Pap test in combination with an HPV test.  Bone density scan. This is done to screen for osteoporosis. You may have this scan if you are at high risk for osteoporosis. Discuss your test results, treatment options,   and if necessary, the need for more tests with your health care provider. Vaccines Your health care provider may recommend certain vaccines, such as:  Influenza vaccine. This is recommended every year.  Tetanus, diphtheria, and acellular pertussis (Tdap, Td) vaccine. You may need a Td booster every 10 years.  Varicella  vaccine. You may need this if you have not been vaccinated.  Zoster vaccine. You may need this after age 36.  Measles, mumps, and rubella (MMR) vaccine. You may need at least one dose of MMR if you were born in 1957 or later. You may also need a second dose.  Pneumococcal 13-valent conjugate (PCV13) vaccine. You may need this if you have certain conditions and were not previously vaccinated.  Pneumococcal polysaccharide (PPSV23) vaccine. You may need one or two doses if you smoke cigarettes or if you have certain conditions.  Meningococcal vaccine. You may need this if you have certain conditions.  Hepatitis A vaccine. You may need this if you have certain conditions or if you travel or work in places where you may be exposed to hepatitis A.  Hepatitis B vaccine. You may need this if you have certain conditions or if you travel or work in places where you may be exposed to hepatitis B.  Haemophilus influenzae type b (Hib) vaccine. You may need this if you have certain conditions. Talk to your health care provider about which screenings and vaccines you need and how often you need them. This information is not intended to replace advice given to you by your health care provider. Make sure you discuss any questions you have with your health care provider. Document Released: 05/21/2015 Document Revised: 06/14/2017 Document Reviewed: 02/23/2015 Elsevier Interactive Patient Education  2019 [place] Screening for Women A cancer screening is a test or exam that checks for cancer. Your health care provider will recommend specific cancer screenings based on your age, personal history, and family history of cancer. Work with your health care provider to create a cancer screening schedule that protects your health. Why is cancer screening done? Cancer screening is done to look for cancer in the very early stages, before it spreads and becomes harder to treat and before you would start to  notice symptoms. Finding cancer early improves the chances of successful treatment. It may save your life. Who should be screened for cancer? All women should be screened for certain cancers, including breast cancer, cervical cancer, and skin cancer. Your health care provider may recommend screenings for other types of cancer if:  You had cancer before.  You have a family member with cancer.  You have abnormal genes that could increase the risk of cancer.  You have risk factors for certain cancers, such as smoking. When you should be screened for cancer depends on:  Your age.  Your medical history and your family's medical history.  Certain lifestyle factors, such as smoking.  Environmental exposure, such as to asbestos. What are some common cancer screenings? Breast cancer Breast cancer screening is done with a test that takes images of breast tissue (mammogram). Here are some screening guidelines:  When you are age 57-44, you will be given the choice to start having mammograms.  When you are age 59-54, you should have a mammogram every year.  You may start having mammograms before age 41 if you have risk factors for breast cancer, such as having an immediate family member with breast cancer.  When you are age 20 or older, you should  have a mammogram every 1-2 years for as long as you are in good health and have a life expectancy of 10 years or more.  It is important to know what your breasts look and feel like so you can report any changes to your health care provider.  Cervical cancer Cervical cancer screening is done with a Pap test. This testchecks for abnormalities, including the virus that causes cervical cancer (human papillomavirus, or HPV). To perform the test, a health care provider takes a swab of cervical cells during a pelvic exam. Screening for cervical cancer with a Pap test should start at age 66. Here are some screening guidelines:  When you are age 71-29, you  should have a Pap test every 3 years.  When you are age 32-65, you should have a Pap test and HPV test every 5 years or have a Pap test every 3 years.  You may be screened for cervical cancer more often if you have risk factors for cervical cancer.  If your Pap tests are abnormal, you may have an HPV test.  If you have had the HPV vaccine, you will still be screened for cervical cancer and follow normal screening recommendations. You do not need to be screened for cervical cancer if any of the following apply to you:  You are older than age 4 and you have not had a serious cervical precancer or cancer in the last 20 years.  Your cervix and uterus have been removed and you have never had cervical cancer or precancerous cells. Endometrial cancer There is no standard screening test for endometrial cancer, but the cancer can be detected with:  A test of a sample of tissue taken from the lining of the uterus (endometrial tissue biopsy).  A vaginal ultrasound.  Pap tests. If you are at increased risk for endometrial cancer, you may need to have these tests more often than normal. You are at increased risk if:  You have a family history of ovarian, uterine, or colon cancer.  You are taking tamoxifen, a drug that is used to treat breast cancer.  You have certain types of colon cancer. If you have reached menopause, it is especially important to talk with your health care provider about any vaginal bleeding or spotting. Screening for endometrial cancer is not recommended for women who do not have symptoms of the cancer, such as vaginal bleeding. Colorectal cancer  All adults should have screening for colorectal cancer starting at age 62 and continuing until age 107. Your health care provider may recommend screening at age 53. You will have tests every 1-10 years, depending on your results and the type of screening test. If you have a family history of colon or rectal cancer or other risk  factors, you may need to start having screenings earlier. Talk with your health care provider about which screening test is right for you and how often you should be screened. Colorectal cancer screening looks for cancer or for growths called polyps that often form before cancer starts. Tests to look for cancer or polyps include:  Colonoscopy or flexible sigmoidoscopy. For these procedures, a flexible tube with a small camera is inserted into the rectum.  CT colonography. This test uses X-rays and a contrast dye to check the colon for polyps. If a polyp is found, you may need to have a colonoscopy so the polyp can be located and removed. Tests to look for cancer in the stool (feces) include:  Guaiac-based fecal occult blood  test (FOBT). This test detects blood in stool. It can be done at home with a kit.  Fecal immunochemical test (FIT). This test detects blood in stool. For this test, you will need to collect stool samples at home.  Stool DNA test. This test looks for blood in stool and any changes in DNA that can lead to colon cancer. For this test, you will need to collect a stool sample at home and send it to a lab.  Skin cancer Skin cancer screening is done by checking the skin for unusual moles or spots and any changes in existing moles. Your health care provider should check your skin for signs of skin cancer at every physical exam. You should check your skin every month and tell your health care provider right away if anything looks unusual. Women with a higher-than-normal risk for skin cancer may want to see a skin specialist (dermatologist) for an annual body check. Lung cancer Lung cancer screening is done with a CT scan that looks for abnormal cells in the lungs. Discuss lung cancer screening with your health care provider if you are 34-35 years old and if any of the following apply to you:  You currently smoke.  You used to smoke heavily.  You have a smoking history of 1 pack a  day for 30 years or 2 packs a day for 15 years.  You have quit smoking within the past 15 years. If you smoke heavily or if you used to smoke, you may need to be screened every year. Where to find more information  [place]: SkinPromotion.no  Centers for Disease Control and Prevention: http://knight-sullivan.biz/  Department of Health and Human Services: BankingDetective.si Contact a health care provider if:  You have concerns about any signs or symptoms of cancer, such as: ? Moles that have an unusual shape or color. ? Changes in existing moles. ? A sore on your skin that does not heal. ? Blood in your stool. ? Fatigue that does not go away. ? Frequent pain or cramping in your abdomen. ? Coughing, or coughing up blood. ? Losing weight without trying. ? Lumps or other changes in your breasts. ? Vaginal bleeding, spotting, or changes in your periods. Summary  Be aware of and watch for signs and symptoms of cancer, especially symptoms of breast cancer, cervical cancer, endometrial cancer, colorectal cancer, skin cancer, and lung cancer.  Early detection of cancer with cancer screening may save your life.  Talk with your health care provider about your specific cancer risks.  Work together with your health care provider to create a cancer screening plan that is right for you. This information is not intended to replace advice given to you by your health care provider. Make sure you discuss any questions you have with your health care provider. Document Released: 01/20/2016 Document Revised: 08/07/2017 Document Reviewed: 01/20/2016 Elsevier Interactive Patient Education  [employer].

## 2018-06-13 NOTE — Progress Notes (Signed)
Subjective:     Cindy Todd is a 43 y.o. female and is here for a comprehensive physical exam. The patient reports no problems.  Pt recently returned from visiting her sister in [place].  Pt's sister had a double mastectomy for breast cancer.  Pt states she needs to schedule her mammogram.    Influenza vaccine up to date.  Social History   Socioeconomic History  . Marital status: Married    Spouse name: Not on file  . Number of children: 1  . Years of education: Not on file  . Highest education level: Not on file  Occupational History  . Not on file  Social Needs  . Financial resource strain: Not on file  . Food insecurity:    Worry: Not on file    Inability: Not on file  . Transportation needs:    Medical: Not on file    Non-medical: Not on file  Tobacco Use  . Smoking status: Never Smoker  . Smokeless tobacco: Never Used  Substance and Sexual Activity  . Alcohol use: No    Frequency: Never  . Drug use: No  . Sexual activity: Not on file  Lifestyle  . Physical activity:    Days per week: Not on file    Minutes per session: Not on file  . Stress: Not on file  Relationships  . Social connections:    Talks on phone: Not on file    Gets together: Not on file    Attends religious service: Not on file    Active member of club or organization: Not on file    Attends meetings of clubs or organizations: Not on file    Relationship status: Not on file  . Intimate partner violence:    Fear of current or ex partner: Not on file    Emotionally abused: Not on file    Physically abused: Not on file    Forced sexual activity: Not on file  Other Topics Concern  . Not on file  Social History Narrative  . Not on file   Health Maintenance  Topic Date Due  . HIV Screening  10/07/1990  . TETANUS/TDAP  10/07/1994  . PAP SMEAR-Modifier  10/06/1996  . INFLUENZA VACCINE  12/06/2017    The following portions of the patient's history were reviewed and updated as  appropriate: allergies, current medications, past family history, past medical history, past social history, past surgical history and problem list.  Review of Systems A comprehensive review of systems was negative.   Objective:    BP 98/70 (BP Location: Left Arm, Patient Position: Sitting, Cuff Size: Normal)   Pulse 70   Temp 98.7 F (37.1 C) (Oral)   Wt 137 lb (62.1 kg)   SpO2 98%   BMI 22.45 kg/m  General appearance: alert, cooperative and no distress Head: Normocephalic, without obvious abnormality, atraumatic Eyes: conjunctivae/corneas clear. PERRL, EOM's intact. Fundi benign. Ears: normal TM's and external ear canals both ears Nose: Nares normal. Septum midline. Mucosa normal. No drainage or sinus tenderness. Throat: lips, mucosa, and tongue normal; teeth and gums normal Neck: no adenopathy, no carotid bruit, no JVD, supple, symmetrical, trachea midline and thyroid not enlarged, symmetric, no tenderness/mass/nodules Lungs: clear to auscultation bilaterally Heart: regular rate and rhythm, S1, S2 normal, no murmur, click, rub or gallop Abdomen: soft, non-tender; bowel sounds normal; no masses,  no organomegaly Extremities: extremities normal, atraumatic, no cyanosis or edema Pulses: 2+ and symmetric Skin: Skin color, texture, turgor normal. No  rashes or lesions Lymph nodes: Cervical, supraclavicular, and axillary nodes normal. Neurologic: Alert and oriented X 3, normal strength and tone. Normal symmetric reflexes. Normal coordination and gait    Assessment:    Healthy female exam.      Plan:     Anticipatory guidance given including wearing seatbelts, smoke detectors in the home, increasing physical activity, increasing p.o. intake of water and vegetables. -will obtain labs -given handout -pt to schedule mammogram -pap up to date. -next CPE in 1 yr See After Visit Summary for Counseling Recommendations    F/u prn  Abbe Amsterdam, MD

## 2018-10-01 ENCOUNTER — Other Ambulatory Visit: Payer: Self-pay | Admitting: Family Medicine

## 2018-10-01 DIAGNOSIS — Z1231 Encounter for screening mammogram for malignant neoplasm of breast: Secondary | ICD-10-CM

## 2018-10-26 ENCOUNTER — Ambulatory Visit: Payer: No Typology Code available for payment source

## 2018-11-05 ENCOUNTER — Other Ambulatory Visit: Payer: Self-pay

## 2018-11-05 ENCOUNTER — Ambulatory Visit
Admission: RE | Admit: 2018-11-05 | Discharge: 2018-11-05 | Disposition: A | Discharge status: Home / Self Care | Payer: No Typology Code available for payment source | Source: Ambulatory Visit | Attending: Family Medicine | Admitting: Family Medicine

## 2018-11-05 DIAGNOSIS — Z1231 Encounter for screening mammogram for malignant neoplasm of breast: Secondary | ICD-10-CM

## 2019-03-21 ENCOUNTER — Other Ambulatory Visit: Payer: Self-pay

## 2019-03-21 DIAGNOSIS — Z20822 Contact with and (suspected) exposure to covid-19: Secondary | ICD-10-CM

## 2019-03-23 ENCOUNTER — Telehealth: Payer: Self-pay | Admitting: *Deleted

## 2019-03-23 NOTE — Telephone Encounter (Signed)
Pt calling for COVID-19 results. Pt informed that results are still pending at this time. Pt informed that she will receive a call when test results are available.

## 2019-03-24 LAB — NOVEL CORONAVIRUS, NAA: SARS-CoV-2, NAA: NOT DETECTED

## 2019-07-12 ENCOUNTER — Ambulatory Visit: Payer: No Typology Code available for payment source | Attending: Internal Medicine

## 2019-07-12 ENCOUNTER — Ambulatory Visit: Payer: No Typology Code available for payment source

## 2019-07-12 DIAGNOSIS — Z23 Encounter for immunization: Secondary | ICD-10-CM | POA: Insufficient documentation

## 2019-07-12 NOTE — Progress Notes (Signed)
   Covid-19 Vaccination Clinic  Name:  Siarra Gilkerson    MRN: [id number] DOB: 1975-10-09  07/12/2019  [name] was observed post Covid-19 immunization for 15 minutes without incident. She was provided with Vaccine Information Sheet and instruction to access the V-Safe system.   Ms. Tranchina was instructed to call 911 with any severe reactions post vaccine: Marland Kitchen Difficulty breathing  . Swelling of face and throat  . A fast heartbeat  . A bad rash all over body  . Dizziness and weakness   Immunizations Administered    Name Date Dose VIS Date Route   Pfizer COVID-19 Vaccine 07/12/2019  8:30 AM 0.3 mL 04/18/2019 Intramuscular   Manufacturer: [employer], [employer]   Lot: [id number]   NDC: [address]-8

## 2019-08-02 ENCOUNTER — Ambulatory Visit: Payer: No Typology Code available for payment source | Attending: Internal Medicine

## 2019-08-02 DIAGNOSIS — Z23 Encounter for immunization: Secondary | ICD-10-CM

## 2019-08-02 NOTE — Progress Notes (Signed)
   Covid-19 Vaccination Clinic  Name:  Cindy Todd    MRN: [id number] DOB: Jan 02, 1976  08/02/2019  [name] was observed post Covid-19 immunization for 15 minutes without incident. She was provided with Vaccine Information Sheet and instruction to access the V-Safe system.   [name] was instructed to call 911 with any severe reactions post vaccine: Marland Kitchen Difficulty breathing  . Swelling of face and throat  . A fast heartbeat  . A bad rash all over body  . Dizziness and weakness   Immunizations Administered    Name Date Dose VIS Date Route   Pfizer COVID-19 Vaccine 08/02/2019  9:23 AM 0.3 mL 04/18/2019 Intramuscular   Manufacturer: [employer], [employer]   Lot: [id number]   NDC: [address]-0

## 2019-08-29 ENCOUNTER — Other Ambulatory Visit: Payer: Self-pay

## 2019-09-01 ENCOUNTER — Other Ambulatory Visit: Payer: Self-pay

## 2019-09-01 ENCOUNTER — Encounter: Payer: Self-pay | Admitting: Family Medicine

## 2019-09-01 ENCOUNTER — Ambulatory Visit (INDEPENDENT_AMBULATORY_CARE_PROVIDER_SITE_OTHER): Payer: No Typology Code available for payment source | Admitting: Family Medicine

## 2019-09-01 VITALS — BP 98/74 | HR 78 | Temp 97.5°F | Wt 134.0 lb

## 2019-09-01 DIAGNOSIS — Z3009 Encounter for other general counseling and advice on contraception: Secondary | ICD-10-CM

## 2019-09-01 DIAGNOSIS — Z13220 Encounter for screening for lipoid disorders: Secondary | ICD-10-CM

## 2019-09-01 DIAGNOSIS — Z0000 Encounter for general adult medical examination without abnormal findings: Secondary | ICD-10-CM

## 2019-09-01 DIAGNOSIS — E559 Vitamin D deficiency, unspecified: Secondary | ICD-10-CM

## 2019-09-01 DIAGNOSIS — Z1321 Encounter for screening for nutritional disorder: Secondary | ICD-10-CM

## 2019-09-01 DIAGNOSIS — Z23 Encounter for immunization: Secondary | ICD-10-CM

## 2019-09-01 LAB — COMPREHENSIVE METABOLIC PANEL
ALT: 13 U/L (ref 0–35)
AST: 14 U/L (ref 0–37)
Albumin: 4.2 g/dL (ref 3.5–5.2)
Alkaline Phosphatase: 41 U/L (ref 39–117)
BUN: 13 mg/dL (ref 6–23)
CO2: 29 mEq/L (ref 19–32)
Calcium: 9.1 mg/dL (ref 8.4–10.5)
Chloride: 104 mEq/L (ref 96–112)
Creatinine, Ser: 0.54 mg/dL (ref 0.40–1.20)
GFR: 122.7 mL/min (ref >60.00)
Glucose, Bld: 90 mg/dL (ref 70–99)
Potassium: 3.9 mEq/L (ref 3.5–5.1)
Sodium: 138 mEq/L (ref 135–145)
Total Bilirubin: 0.5 mg/dL (ref 0.2–1.2)
Total Protein: 6.6 g/dL (ref 6.0–8.3)

## 2019-09-01 LAB — CBC WITH DIFFERENTIAL/PLATELET
Basophils Absolute: 0 10*3/uL (ref 0.0–0.1)
Basophils Relative: 0.3 % (ref 0.0–3.0)
Eosinophils Absolute: 0.1 10*3/uL (ref 0.0–0.7)
Eosinophils Relative: 1.9 % (ref 0.0–5.0)
HCT: 36.4 % (ref 36.0–46.0)
Hemoglobin: 12.3 g/dL (ref 12.0–15.0)
Lymphocytes Relative: 26.5 % (ref 12.0–46.0)
Lymphs Abs: 1.3 10*3/uL (ref 0.7–4.0)
MCHC: 33.9 g/dL (ref 30.0–36.0)
MCV: 87.8 fl (ref 78.0–100.0)
Monocytes Absolute: 0.3 10*3/uL (ref 0.1–1.0)
Monocytes Relative: 6.7 % (ref 3.0–12.0)
Neutro Abs: 3.3 10*3/uL (ref 1.4–7.7)
Neutrophils Relative %: 64.6 % (ref 43.0–77.0)
Platelets: 209 10*3/uL (ref 150.0–400.0)
RBC: 4.14 Mil/uL (ref 3.87–5.11)
RDW: 13.1 % (ref 11.5–15.5)
WBC: 5.1 10*3/uL (ref 4.0–10.5)

## 2019-09-01 LAB — HEMOGLOBIN A1C: Hgb A1c MFr Bld: 5.7 % (ref 4.6–6.5)

## 2019-09-01 LAB — LIPID PANEL
Cholesterol: 132 mg/dL (ref 0–200)
HDL: 56.1 mg/dL (ref >39.00)
LDL Cholesterol: 68 mg/dL (ref 0–99)
NonHDL: 76.17
Total CHOL/HDL Ratio: 2
Triglycerides: 40 mg/dL (ref 0.0–149.0)
VLDL: 8 mg/dL (ref 0.0–40.0)

## 2019-09-01 LAB — T4, FREE: Free T4: 0.98 ng/dL (ref 0.60–1.60)

## 2019-09-01 LAB — VITAMIN D 25 HYDROXY (VIT D DEFICIENCY, FRACTURES): VITD: 17.3 ng/mL — ABNORMAL LOW (ref 30.00–100.00)

## 2019-09-01 LAB — TSH: TSH: 1.42 u[IU]/mL (ref 0.35–4.50)

## 2019-09-01 MED ORDER — VITAMIN D (ERGOCALCIFEROL) 1.25 MG (50000 UNIT) PO CAPS: 50000.0000 [IU] | ORAL_CAPSULE | ORAL | 0 refills | Status: DC

## 2019-09-01 NOTE — Progress Notes (Signed)
Subjective:     Cindy Todd is a 44 y.o. female and is here for a comprehensive physical exam. The patient reports no problems.  Pt is doing well overall.  Pt is considering trying to become pregnant. Pt has an IUD in place, but is thinking of having it removed next month.  Pt and her husband have a 48 yo daughter.  Pt would like Tdap vaccine.  Pt has mammogram coming up.  Pt needs to schedule pap.  Social History   Socioeconomic History  . Marital status: Married    Spouse name: Not on file  . Number of children: 1  . Years of education: Not on file  . Highest education level: Not on file  Occupational History  . Not on file  Tobacco Use  . Smoking status: Never Smoker  . Smokeless tobacco: Never Used  Substance and Sexual Activity  . Alcohol use: No  . Drug use: No  . Sexual activity: Not on file  Other Topics Concern  . Not on file  Social History Narrative  . Not on file   Social Determinants of Health   Financial Resource Strain:   . Difficulty of Paying Living Expenses:   Food Insecurity:   . Worried About Programme researcher, broadcasting/film/video in the Last Year:   . Barista in the Last Year:   Transportation Needs:   . Freight forwarder (Medical):   Marland Kitchen Lack of Transportation (Non-Medical):   Physical Activity:   . Days of Exercise per Week:   . Minutes of Exercise per Session:   Stress:   . Feeling of Stress :   Social Connections:   . Frequency of Communication with Friends and Family:   . Frequency of Social Gatherings with Friends and Family:   . Attends Religious Services:   . Active Member of Clubs or Organizations:   . Attends Banker Meetings:   Marland Kitchen Marital Status:   Intimate Partner Violence:   . Fear of Current or Ex-Partner:   . Emotionally Abused:   Marland Kitchen Physically Abused:   . Sexually Abused:    Health Maintenance  Topic Date Due  . HIV Screening  Never done  . TETANUS/TDAP  Never done  . PAP SMEAR-Modifier  Never done  .  INFLUENZA VACCINE  12/07/2019  . COVID-19 Vaccine  Completed    The following portions of the patient's history were reviewed and updated as appropriate: allergies, current medications, past family history, past medical history, past social history, past surgical history and problem list.  Review of Systems Pertinent items noted in HPI and remainder of comprehensive ROS otherwise negative.   Objective:    BP 98/74 (BP Location: Left Arm, Patient Position: Sitting, Cuff Size: Normal)   Pulse 78   Temp (!) 97.5 F (36.4 C) (Temporal)   Wt 134 lb (60.8 kg)   SpO2 99%   BMI 21.96 kg/m  General appearance: alert, cooperative and no distress Head: Normocephalic, without obvious abnormality, atraumatic Eyes: conjunctivae/corneas clear. PERRL, EOM's intact. Fundi benign. Ears: normal TM's and external ear canals both ears Nose: Nares normal. Septum midline. Mucosa normal. No drainage or sinus tenderness. Throat: lips, mucosa, and tongue normal; teeth and gums normal Neck: no adenopathy, no carotid bruit, no JVD, supple, symmetrical, trachea midline and thyroid not enlarged, symmetric, no tenderness/mass/nodules Lungs: clear to auscultation bilaterally Heart: regular rate and rhythm, S1, S2 normal, no murmur, click, rub or gallop Abdomen: soft, non-tender; bowel sounds normal;  no masses,  no organomegaly Extremities: extremities normal, atraumatic, no cyanosis or edema Pulses: 2+ and symmetric Skin: Skin color, texture, turgor normal. No rashes or lesions Lymph nodes: Cervical, supraclavicular, and axillary nodes normal. Neurologic: Alert and oriented X 3, normal strength and tone. Normal symmetric reflexes. Normal coordination and gait    Assessment:    Healthy female exam.      Plan:     Anticipatory guidance given including wearing seatbelts, smoke detectors in the home, increasing physical activity, increasing p.o. intake of water and vegetables. -will obtain labs -pt to  schedule mammogram -We will obtain Pap at time of IUD removal next month.  Pt to decide if having done OB/GYN or with PCP. See After Visit Summary for Counseling Recommendations    Need for Tdap vaccination  - Plan: Tdap vaccine greater than or equal to 7yo IM  Screening for cholesterol level  - Plan: Lipid panel  Family planning advice  -Discussed starting prenatal vitamins -Discussed risk associated with AMA -Discussed having the IUD removed next month.  Patient to decide having done with OB/GYN or with PCP.  Will obtain Pap at that time. -Given handouts - Plan: TSH, T4, Free, Hemoglobin A1c  Encounter for vitamin deficiency screening  - Plan: Vitamin D, 25-hydroxy   Vitamin D deficiency  -Vitamin D came back at 17.30.  Rx for ergocalciferol sent to patient's pharmacy.  F/u prn in the next month for IUD removal and pap  Grier Mitts, MD

## 2019-09-01 NOTE — Patient Instructions (Signed)
Preventive Care 40-44 Years Old, Female Preventive care refers to visits with your health care provider and lifestyle choices that can promote health and wellness. This includes:  A yearly physical exam. This may also be called an annual well check.  Regular dental visits and eye exams.  Immunizations.  Screening for certain conditions.  Healthy lifestyle choices, such as eating a healthy diet, getting regular exercise, not using drugs or products that contain nicotine and tobacco, and limiting alcohol use. What can I expect for my preventive care visit? Physical exam Your health care provider will check your:  Height and weight. This may be used to calculate body mass index (BMI), which tells if you are at a healthy weight.  Heart rate and blood pressure.  Skin for abnormal spots. Counseling Your health care provider may ask you questions about your:  Alcohol, tobacco, and drug use.  Emotional well-being.  Home and relationship well-being.  Sexual activity.  Eating habits.  Work and work environment.  Method of birth control.  Menstrual cycle.  Pregnancy history. What immunizations do I need?  Influenza (flu) vaccine  This is recommended every year. Tetanus, diphtheria, and pertussis (Tdap) vaccine  You may need a Td booster every 10 years. Varicella (chickenpox) vaccine  You may need this if you have not been vaccinated. Zoster (shingles) vaccine  You may need this after age 60. Measles, mumps, and rubella (MMR) vaccine  You may need at least one dose of MMR if you were born in 1957 or later. You may also need a second dose. Pneumococcal conjugate (PCV13) vaccine  You may need this if you have certain conditions and were not previously vaccinated. Pneumococcal polysaccharide (PPSV23) vaccine  You may need one or two doses if you smoke cigarettes or if you have certain conditions. Meningococcal conjugate (MenACWY) vaccine  You may need this if you  have certain conditions. Hepatitis A vaccine  You may need this if you have certain conditions or if you travel or work in places where you may be exposed to hepatitis A. Hepatitis B vaccine  You may need this if you have certain conditions or if you travel or work in places where you may be exposed to hepatitis B. Haemophilus influenzae type b (Hib) vaccine  You may need this if you have certain conditions. Human papillomavirus (HPV) vaccine  If recommended by your health care provider, you may need three doses over 6 months. You may receive vaccines as individual doses or as more than one vaccine together in one shot (combination vaccines). Talk with your health care provider about the risks and benefits of combination vaccines. What tests do I need? Blood tests  Lipid and cholesterol levels. These may be checked every 5 years, or more frequently if you are over 50 years old.  Hepatitis C test.  Hepatitis B test. Screening  Lung cancer screening. You may have this screening every year starting at age 55 if you have a 30-pack-year history of smoking and currently smoke or have quit within the past 15 years.  Colorectal cancer screening. All adults should have this screening starting at age 50 and continuing until age 75. Your health care provider may recommend screening at age 45 if you are at increased risk. You will have tests every 1-10 years, depending on your results and the type of screening test.  Diabetes screening. This is done by checking your blood sugar (glucose) after you have not eaten for a while (fasting). You may have this   done every 1-3 years.  Mammogram. This may be done every 1-2 years. Talk with your health care provider about when you should start having regular mammograms. This may depend on whether you have a family history of breast cancer.  BRCA-related cancer screening. This may be done if you have a family history of breast, ovarian, tubal, or peritoneal  cancers.  Pelvic exam and Pap test. This may be done every 3 years starting at age 51. Starting at age 70, this may be done every 5 years if you have a Pap test in combination with an HPV test. Other tests  Sexually transmitted disease (STD) testing.  Bone density scan. This is done to screen for osteoporosis. You may have this scan if you are at high risk for osteoporosis. Follow these instructions at home: Eating and drinking  Eat a diet that includes fresh fruits and vegetables, whole grains, lean protein, and low-fat dairy.  Take vitamin and mineral supplements as recommended by your health care provider.  Do not drink alcohol if: ? Your health care provider tells you not to drink. ? You are pregnant, may be pregnant, or are planning to become pregnant.  If you drink alcohol: ? Limit how much you have to 0-1 drink a day. ? Be aware of how much alcohol is in your drink. In the [place] one drink equals one 12 oz bottle of beer (355 mL), one 5 oz glass of wine (148 mL), or one 1 oz glass of hard liquor (44 mL). Lifestyle  Take daily care of your teeth and gums.  Stay active. Exercise for at least 30 minutes on 5 or more days each week.  Do not use any products that contain nicotine or tobacco, such as cigarettes, e-cigarettes, and chewing tobacco. If you need help quitting, ask your health care provider.  If you are sexually active, practice safe sex. Use a condom or other form of birth control (contraception) in order to prevent pregnancy and STIs (sexually transmitted infections).  If told by your health care provider, take low-dose aspirin daily starting at age 80. What's next?  Visit your health care provider once a year for a well check visit.  Ask your health care provider how often you should have your eyes and teeth checked.  Stay up to date on all vaccines. This information is not intended to replace advice given to you by your health care provider. Make sure you  discuss any questions you have with your health care provider. Document Revised: 01/03/2018 Document Reviewed: 01/03/2018 Elsevier Patient Education  [place].  Pregnancy After Age 44 Women who become pregnant after the age of 27 have a higher risk for certain problems during pregnancy. This is because older women may already have health problems before becoming pregnant. Older women who are healthy before pregnancy may still develop problems during pregnancy. These problems may affect the mother, the unborn baby (fetus), or both. What are the risks for me? If you are over age 29 and you want to become pregnant or are pregnant, you may have a higher risk of:  Not being able to get pregnant (infertility).  Going into labor early (preterm labor).  Needing surgical delivery of your baby (cesarean delivery, or C-section).  Having high blood pressure (hypertension).  Having complications during pregnancy, such as high blood pressure and other symptoms (preeclampsia).  Having diabetes during pregnancy (gestational diabetes).  Being pregnant with more than one baby.  Loss of the unborn baby before 39  weeks (miscarriage) or after 20 weeks of pregnancy (stillbirth). What are the risks for my baby? Babies born to women over the age of 79 have a higher risk for:  Being born early (prematurity).  Low birth weight, which is less than 5 lb, 8 oz (2.5 kg).  Birth defects, such as Down syndrome and cleft palate.  Health complications, including problems with growth and development. How is prenatal care different for women over age 45? All women should see their health care provider before they try to become pregnant. This is especially important for women over the age of 39. Tell your health care provider about:  Any health problems you have.  Any medicines you take.  Any family history of health problems or chromosome-related defects.  Any problems you have had with past  pregnancies or deliveries. If you are over age 38 and you plan to become pregnant:  Start taking a daily multivitamin a month or more before you try to get pregnant. Your multivitamin should contain 400 mcg (micrograms) of folic acid. If you are over age 61 and pregnant, make sure you:  Keep taking your multivitamin unless your health care provider tells you not to take it.  Keep all prenatal visits as told by your health care provider. This is important.  Have ultrasounds regularly throughout your pregnancy to check for problems.  Talk with your health care provider about other prenatal screening tests that you may need. What additional prenatal tests are needed? Screening tests show whether your baby has a higher risk for birth defects than other babies. Screening tests include:  Ultrasound tests to look for markers that indicate a risk for birth defects.  Maternal blood screening. These are blood tests that measure certain substances in your blood to determine your baby's risk for defects. Screening tests do not show whether your baby has or does not have defects. They only show your baby's risk for certain defects. If your screening tests show that risk factors are present, you may need tests to confirm the defect (diagnostic testing). These tests may include:  Chorionic villus sampling. For this procedure, a tissue sample is taken from the organ that forms in your uterus to nourish your baby (placenta). The sample is removed through your cervix or abdomen and tested.  Amniocentesis. For this procedure, a small amount of the fluid that surrounds the baby in the uterus (amniotic fluid) is removed and tested. What can I do to stay healthy during my pregnancy? Staying healthy during pregnancy can help you and your baby to have a lower risk for problems during pregnancy, during delivery, or both. Talk with your health care provider for specific instructions about staying healthy during your  pregnancy. Nutrition   At each meal, eat a variety of foods from each of the five food groups. These groups include: ? Proteins such as lean meats, poultry, fish that is low in fat, beans, eggs, and nuts. ? Vegetables such as leafy greens, raw and cooked vegetables, and vegetable juice. ? Fruits that are fresh, frozen, or canned, or 100% fruit juice. ? Dairy products such as low-fat yogurt, cheese, and milk. ? Whole grains including rice, cereal, pasta, and bread.  Talk with your health care provider about how much food in each group is right for you.  Follow instructions from your health care provider about eating and drinking restrictions during pregnancy. ? Do not eat raw eggs, raw meat, or raw fish or seafood. ? Do not eat any fish  that contains high amounts of mercury, such as swordfish or mackerel.  Drink 6-8 or more glasses of water a day. You should drink enough fluid to keep your urine pale yellow. Managing weight gain  Ask your health care provider how much weight gain is healthy during pregnancy.  Stay at a healthy weight. If needed, work with your health care provider to lose weight safely. Activity  Exercise regularly, as directed by your health care provider. Ask your health care provider what forms of exercise are safe for you. General instructions  Do not use any products that contain nicotine or tobacco, such as cigarettes and e-cigarettes. If you need help quitting, ask your health care provider.  Do not drink alcohol, use drugs, or abuse prescription medicine.  Take over-the-counter and prescription medicines only as told by your health care provider.  Do not use hot tubs, steam rooms, or saunas.  Talk with your health care provider about your risk of exposure to harmful environmental conditions. This includes exposure to chemicals, radiation, cleaning products, and cat feces. Follow advice from your health care provider about how to limit your  exposure. Summary  Women who become pregnant after the age of 52 have a higher risk for complications during pregnancy.  Problems may affect the mother, the unborn baby (fetus), or both.  All women should see their health care provider before they try to become pregnant. This is especially important for women over the age of 49.  Staying healthy during pregnancy can help both you and your baby to have a lower risk for some of the problems that can happen during pregnancy, during delivery, or both. This information is not intended to replace advice given to you by your health care provider. Make sure you discuss any questions you have with your health care provider. Document Revised: 08/16/2018 Document Reviewed: 08/14/2016 Elsevier Patient Education  [place].

## 2019-10-29 ENCOUNTER — Other Ambulatory Visit: Payer: Self-pay

## 2019-10-30 ENCOUNTER — Other Ambulatory Visit (HOSPITAL_COMMUNITY)
Admission: RE | Admit: 2019-10-30 | Discharge: 2019-10-30 | Disposition: A | Discharge status: Home / Self Care | Payer: No Typology Code available for payment source | Source: Ambulatory Visit | Attending: Family Medicine | Admitting: Family Medicine

## 2019-10-30 ENCOUNTER — Encounter: Payer: Self-pay | Admitting: Family Medicine

## 2019-10-30 ENCOUNTER — Ambulatory Visit (INDEPENDENT_AMBULATORY_CARE_PROVIDER_SITE_OTHER): Payer: No Typology Code available for payment source | Admitting: Family Medicine

## 2019-10-30 VITALS — BP 98/62 | HR 78 | Temp 97.3°F | Wt 134.0 lb

## 2019-10-30 DIAGNOSIS — Z30432 Encounter for removal of intrauterine contraceptive device: Secondary | ICD-10-CM

## 2019-10-30 DIAGNOSIS — Z124 Encounter for screening for malignant neoplasm of cervix: Secondary | ICD-10-CM | POA: Diagnosis not present

## 2019-10-30 NOTE — Progress Notes (Signed)
Subjective:    Patient ID: Cindy Todd, female    DOB: 1975-07-20, 44 y.o.   MRN: [id number]  No chief complaint on file.   HPI Patient was seen today for pap and IUD removal.  Pt denies issues.  IUD in place x 5 yrs after the birth of her daughter, Zollie Scale.  Due for removal 3 days ago.  Pt notes normal paps in the past.  Pt and her husband are considering becoming pregnant.  History reviewed. No pertinent past medical history.  No Known Allergies  ROS General: Denies fever, chills, night sweats, changes in weight, changes in appetite HEENT: Denies headaches, ear pain, changes in vision, rhinorrhea, sore throat CV: Denies CP, palpitations, SOB, orthopnea Pulm: Denies SOB, cough, wheezing GI: Denies abdominal pain, nausea, vomiting, diarrhea, constipation GU: Denies dysuria, hematuria, frequency, vaginal discharge Msk: Denies muscle cramps, joint pains Neuro: Denies weakness, numbness, tingling Skin: Denies rashes, bruising Psych: Denies depression, anxiety, hallucinations  Objective:    Blood pressure 98/62, pulse 78, temperature (!) 97.3 F (36.3 C), temperature source Temporal, weight 134 lb (60.8 kg), SpO2 98 %.   Gen. Pleasant, well-nourished, in no distress, normal affect   HEENT: Rachel/AT, face symmetric, no scleral icterus, PERRLA, EOMI, nares patent without drainage Lungs: no accessory muscle use Cardiovascular: RRR, no peripheral edema GU: Normal external female genitalia without lesions. Normal vaginal rugae. Moderate discharge in vaginal vault with mucus consistency.  IUD strings present.  Pap obtained.   IUD removed. Neuro:  A&Ox3, CN II-XII intact, normal gait  Wt Readings from Last 3 Encounters:  10/30/19 134 lb (60.8 kg)  09/01/19 134 lb (60.8 kg)  06/13/18 137 lb (62.1 kg)    Lab Results  Component Value Date   WBC 5.1 09/01/2019   HGB 12.3 09/01/2019   HCT 36.4 09/01/2019   PLT 209.0 09/01/2019   GLUCOSE 90 09/01/2019   CHOL 132 09/01/2019    TRIG 40.0 09/01/2019   HDL 56.10 09/01/2019   LDLCALC 68 09/01/2019   ALT 13 09/01/2019   AST 14 09/01/2019   NA 138 09/01/2019   K 3.9 09/01/2019   CL 104 09/01/2019   CREATININE 0.54 09/01/2019   BUN 13 09/01/2019   CO2 29 09/01/2019   TSH 1.42 09/01/2019   HGBA1C 5.7 09/01/2019   PROCEDURE NOTE: IUD removal Informed consent for IUD removal obtained.  She is aware this will stop the birth control method provided by the IUD immediately.  Appropriate time out taken. Sterile technique used.  Patient placed in the lithotomy position and the cervix brought into view using speculum. The IUD strings were identified coming from the cervical os. These strings were grasped with ring forceps, and the IUD withdrawn gently from the uterus. There were no complications and no blood loss.    Assessment/Plan:  Encounter for IUD removal -consent obtained. -see procedure note above. -no complications -pt advised to consider methods of birth control as can become pregnant.  Cervical cancer screening  - Plan: PAP [[place]]  F/u prn  Abbe Amsterdam, MD

## 2019-10-30 NOTE — Patient Instructions (Signed)

## 2019-11-03 LAB — CYTOLOGY - PAP
Chlamydia: NEGATIVE
Comment: NEGATIVE
Comment: NEGATIVE
Comment: NEGATIVE
Comment: NORMAL
Diagnosis: NEGATIVE
High risk HPV: NEGATIVE
Neisseria Gonorrhea: NEGATIVE
Trichomonas: NEGATIVE

## 2020-01-09 ENCOUNTER — Other Ambulatory Visit: Payer: Self-pay | Admitting: Family Medicine

## 2020-01-09 DIAGNOSIS — Z1231 Encounter for screening mammogram for malignant neoplasm of breast: Secondary | ICD-10-CM

## 2020-01-29 ENCOUNTER — Ambulatory Visit
Admission: RE | Admit: 2020-01-29 | Discharge: 2020-01-29 | Disposition: A | Discharge status: Home / Self Care | Payer: No Typology Code available for payment source | Source: Ambulatory Visit | Attending: Family Medicine | Admitting: Family Medicine

## 2020-01-29 ENCOUNTER — Other Ambulatory Visit: Payer: Self-pay

## 2020-01-29 DIAGNOSIS — Z1231 Encounter for screening mammogram for malignant neoplasm of breast: Secondary | ICD-10-CM

## 2020-02-04 ENCOUNTER — Ambulatory Visit (INDEPENDENT_AMBULATORY_CARE_PROVIDER_SITE_OTHER): Payer: No Typology Code available for payment source | Admitting: Family Medicine

## 2020-02-04 ENCOUNTER — Other Ambulatory Visit: Payer: Self-pay

## 2020-02-04 ENCOUNTER — Encounter: Payer: Self-pay | Admitting: Family Medicine

## 2020-02-04 VITALS — BP 112/62 | HR 74 | Temp 97.7°F | Ht 66.75 in | Wt 137.2 lb

## 2020-02-04 DIAGNOSIS — L219 Seborrheic dermatitis, unspecified: Secondary | ICD-10-CM | POA: Diagnosis not present

## 2020-02-04 DIAGNOSIS — L309 Dermatitis, unspecified: Secondary | ICD-10-CM

## 2020-02-04 NOTE — Progress Notes (Signed)
Established Patient Office Visit  Subjective:  Patient ID: Cindy Todd, female    DOB: [birth year]  Age: 44 y.o. MRN: [id number]  CC:  Chief Complaint  Patient presents with  . Rash    right thumb, itchy, started on a few days ago  . Hair/Scalp Problem    scalp is itchy also    HPI Cindy Todd presents for rash on her right hand and dry flaky scalp.  She has had the scalp condition for some time.  She used some type of gel on her hair and thinks it may be exacerbating.  She does have some pruritus.  She is not tried any recent antiseborrhea shampoos.  She has 3-day history of dry scaly slightly pruritic rash on the dorsum of her right thumb near the base.  She knew there was some hand-foot-and-mouth going around and wondered if that was the case.  She has not had any pustular lesions.  No sore throat.  No fever.  No generalized rash.  History reviewed. No pertinent past medical history.  History reviewed. No pertinent surgical history.  Family History  Problem Relation Age of Onset  . Hypertension Mother   . Heart disease Father   . Breast cancer Sister 47  . Breast cancer Maternal Aunt 43    Social History   Socioeconomic History  . Marital status: Married    Spouse name: Not on file  . Number of children: 1  . Years of education: Not on file  . Highest education level: Not on file  Occupational History  . Not on file  Tobacco Use  . Smoking status: Never Smoker  . Smokeless tobacco: Never Used  Substance and Sexual Activity  . Alcohol use: No  . Drug use: No  . Sexual activity: Not on file  Other Topics Concern  . Not on file  Social History Narrative  . Not on file   Social Determinants of Health   Financial Resource Strain:   . Difficulty of Paying Living Expenses: Not on file  Food Insecurity:   . Worried About Programme researcher, broadcasting/film/video in the Last Year: Not on file  . Ran Out of Food in the Last Year: Not on file    Transportation Needs:   . Lack of Transportation (Medical): Not on file  . Lack of Transportation (Non-Medical): Not on file  Physical Activity:   . Days of Exercise per Week: Not on file  . Minutes of Exercise per Session: Not on file  Stress:   . Feeling of Stress : Not on file  Social Connections:   . Frequency of Communication with Friends and Family: Not on file  . Frequency of Social Gatherings with Friends and Family: Not on file  . Attends Religious Services: Not on file  . Active Member of Clubs or Organizations: Not on file  . Attends Banker Meetings: Not on file  . Marital Status: Not on file  Intimate Partner Violence:   . Fear of Current or Ex-Partner: Not on file  . Emotionally Abused: Not on file  . Physically Abused: Not on file  . Sexually Abused: Not on file    Outpatient Medications Prior to Visit  Medication Sig Dispense Refill  . fluticasone (FLONASE) 50 MCG/ACT nasal spray Place 1 spray into both nostrils daily. (Patient not taking: Reported on 02/04/2020) 16 g 0  . triamcinolone cream (KENALOG) 0.1 % Apply 1 application topically 2 (two) times daily as needed. Limit  use to 2 weeks or less (Patient not taking: Reported on 02/04/2020) 15 g 0  . Vitamin D, Ergocalciferol, (DRISDOL) 1.25 MG (50000 UNIT) CAPS capsule Take 1 capsule (50,000 Units total) by mouth every 7 (seven) days. (Patient not taking: Reported on 02/04/2020) 12 capsule 0   No facility-administered medications prior to visit.    No Known Allergies  ROS Review of Systems  Constitutional: Negative for chills and fever.  HENT: Negative for sore throat.   Skin: Positive for rash.      Objective:    Physical Exam Vitals reviewed.  Constitutional:      Appearance: Normal appearance.  Cardiovascular:     Pulses: Normal pulses.     Heart sounds: Normal heart sounds.  Skin:    Findings: Rash present.     Comments: She has approximately 1 x 1 cm area of dry scaly rash dorsum  right thumb just distal to the [facility] joint.  Scalp reveals some diffuse flaking.  No hair loss.  Neurological:     Mental Status: She is alert.     BP 112/62   Pulse 74   Temp 97.7 F (36.5 C) (Oral)   Ht 5' 6.75" (1.695 m)   Wt 137 lb 3.2 oz (62.2 kg)   LMP 01/26/2020   SpO2 97%   BMI 21.65 kg/m  Wt Readings from Last 3 Encounters:  02/04/20 137 lb 3.2 oz (62.2 kg)  10/30/19 134 lb (60.8 kg)  09/01/19 134 lb (60.8 kg)     Health Maintenance Due  Topic Date Due  . Hepatitis C Screening  Never done  . HIV Screening  Never done  . INFLUENZA VACCINE  12/07/2019    There are no preventive care reminders to display for this patient.  Lab Results  Component Value Date   TSH 1.42 09/01/2019   Lab Results  Component Value Date   WBC 5.1 09/01/2019   HGB 12.3 09/01/2019   HCT 36.4 09/01/2019   MCV 87.8 09/01/2019   PLT 209.0 09/01/2019   Lab Results  Component Value Date   NA 138 09/01/2019   K 3.9 09/01/2019   CO2 29 09/01/2019   GLUCOSE 90 09/01/2019   BUN 13 09/01/2019   CREATININE 0.54 09/01/2019   BILITOT 0.5 09/01/2019   ALKPHOS 41 09/01/2019   AST 14 09/01/2019   ALT 13 09/01/2019   PROT 6.6 09/01/2019   ALBUMIN 4.2 09/01/2019   CALCIUM 9.1 09/01/2019   GFR 122.70 09/01/2019   Lab Results  Component Value Date   CHOL 132 09/01/2019   Lab Results  Component Value Date   HDL 56.10 09/01/2019   Lab Results  Component Value Date   LDLCALC 68 09/01/2019   Lab Results  Component Value Date   TRIG 40.0 09/01/2019   Lab Results  Component Value Date   CHOLHDL 2 09/01/2019   Lab Results  Component Value Date   HGBA1C 5.7 09/01/2019      Assessment & Plan:   Problem List Items Addressed This Visit    None    Visit Diagnoses    Eczema, unspecified type    -  Primary   Seborrheic dermatitis        She has nonspecific eczematous rash right thumb.  We recommend triamcinolone 0.1% cream twice daily and follow-up with primary in 1 to 2  weeks if not improving  Suggested over-the-counter shampoo such as Selsun Blue with conditioner, T-Gel, or Nizoral for her scalp condition  No orders  of the defined types were placed in this encounter.   Follow-up: No follow-ups on file.    Carolann Littler, MD

## 2020-02-04 NOTE — Patient Instructions (Signed)
Seborrheic Dermatitis, Adult Seborrheic dermatitis is a skin disease that causes red, scaly patches. It usually occurs on the scalp, and it is often called dandruff. The patches may appear on other parts of the body. Skin patches tend to appear where there are many oil glands in the skin. Areas of the body that are commonly affected include:  Scalp.  Skin folds of the body.  Ears.  Eyebrows.  Neck.  Face.  Armpits.  The bearded area of men's faces. The condition may come and go for no known reason, and it is often long-lasting (chronic). What are the causes? The cause of this condition is not known. What increases the risk? This condition is more likely to develop in people who:  Have certain conditions, such as: ? HIV (human immunodeficiency virus). ? AIDS (acquired immunodeficiency syndrome). ? Parkinson disease. ? Mood disorders, such as depression.  Are 54-3 years old. What are the signs or symptoms? Symptoms of this condition include:  Thick scales on the scalp.  Redness on the face or in the armpits.  Skin that is flaky. The flakes may be white or yellow.  Skin that seems oily or dry but is not helped with moisturizers.  Itching or burning in the affected areas. How is this diagnosed? This condition is diagnosed with a medical history and physical exam. A sample of your skin may be tested (skin biopsy). You may need to see a skin specialist (dermatologist). How is this treated? There is no cure for this condition, but treatment can help to manage the symptoms. You may get treatment to remove scales, lower the risk of skin infection, and reduce swelling or itching. Treatment may include:  Creams that reduce swelling and irritation (steroids).  Creams that reduce skin yeast.  Medicated shampoo, soaps, moisturizing creams, or ointments.  Medicated moisturizing creams or ointments. Follow these instructions at home:  Apply over-the-counter and prescription  medicines only as told by your health care provider.  Use any medicated shampoo, soaps, skin creams, or ointments only as told by your health care provider.  Keep all follow-up visits as told by your health care provider. This is important. Contact a health care provider if:  Your symptoms do not improve with treatment.  Your symptoms get worse.  You have new symptoms. This information is not intended to replace advice given to you by your health care provider. Make sure you discuss any questions you have with your health care provider. Document Revised: 04/06/2017 Document Reviewed: 08/12/2015 Elsevier Patient Education  2020 [employer].  Use the Triamcinolone cream to hand twice daily as needed  Consider Selsun Blue shampoo with conditioner, T-gel shampoo, or Nizoral.

## 2020-10-08 ENCOUNTER — Ambulatory Visit (INDEPENDENT_AMBULATORY_CARE_PROVIDER_SITE_OTHER): Payer: No Typology Code available for payment source | Admitting: Family Medicine

## 2020-10-08 ENCOUNTER — Other Ambulatory Visit: Payer: Self-pay

## 2020-10-08 ENCOUNTER — Encounter: Payer: Self-pay | Admitting: Family Medicine

## 2020-10-08 VITALS — BP 102/66 | HR 90 | Temp 98.0°F | Ht 66.0 in | Wt 139.0 lb

## 2020-10-08 DIAGNOSIS — Z1211 Encounter for screening for malignant neoplasm of colon: Secondary | ICD-10-CM | POA: Diagnosis not present

## 2020-10-08 DIAGNOSIS — Z0000 Encounter for general adult medical examination without abnormal findings: Secondary | ICD-10-CM

## 2020-10-08 DIAGNOSIS — Z13220 Encounter for screening for lipoid disorders: Secondary | ICD-10-CM

## 2020-10-08 DIAGNOSIS — Z1159 Encounter for screening for other viral diseases: Secondary | ICD-10-CM

## 2020-10-08 LAB — CBC WITH DIFFERENTIAL/PLATELET
Basophils Absolute: 0 10*3/uL (ref 0.0–0.1)
Basophils Relative: 0.5 % (ref 0.0–3.0)
Eosinophils Absolute: 0.1 10*3/uL (ref 0.0–0.7)
Eosinophils Relative: 3.1 % (ref 0.0–5.0)
HCT: 35.6 % — ABNORMAL LOW (ref 36.0–46.0)
Hemoglobin: 12.1 g/dL (ref 12.0–15.0)
Lymphocytes Relative: 35.1 % (ref 12.0–46.0)
Lymphs Abs: 1.5 10*3/uL (ref 0.7–4.0)
MCHC: 34 g/dL (ref 30.0–36.0)
MCV: 86 fl (ref 78.0–100.0)
Monocytes Absolute: 0.4 10*3/uL (ref 0.1–1.0)
Monocytes Relative: 8.1 % (ref 3.0–12.0)
Neutro Abs: 2.3 10*3/uL (ref 1.4–7.7)
Neutrophils Relative %: 53.2 % (ref 43.0–77.0)
Platelets: 214 10*3/uL (ref 150.0–400.0)
RBC: 4.14 Mil/uL (ref 3.87–5.11)
RDW: 13.1 % (ref 11.5–15.5)
WBC: 4.3 10*3/uL (ref 4.0–10.5)

## 2020-10-08 LAB — TSH: TSH: 1.35 u[IU]/mL (ref 0.35–4.50)

## 2020-10-08 LAB — T4, FREE: Free T4: 0.82 ng/dL (ref 0.60–1.60)

## 2020-10-08 LAB — HEMOGLOBIN A1C: Hgb A1c MFr Bld: 6.1 % (ref 4.6–6.5)

## 2020-10-08 NOTE — Patient Instructions (Signed)
Preventive Care 84-45 Years Old, Female Preventive care refers to lifestyle choices and visits with your health care provider that can promote health and wellness. This includes:  A yearly physical exam. This is also called an annual wellness visit.  Regular dental and eye exams.  Immunizations.  Screening for certain conditions.  Healthy lifestyle choices, such as: ? Eating a healthy diet. ? Getting regular exercise. ? Not using drugs or products that contain nicotine and tobacco. ? Limiting alcohol use. What can I expect for my preventive care visit? Physical exam Your health care provider will check your:  Height and weight. These may be used to calculate your BMI (body mass index). BMI is a measurement that tells if you are at a healthy weight.  Heart rate and blood pressure.  Body temperature.  Skin for abnormal spots. Counseling Your health care provider may ask you questions about your:  Past medical problems.  Family's medical history.  Alcohol, tobacco, and drug use.  Emotional well-being.  Home life and relationship well-being.  Sexual activity.  Diet, exercise, and sleep habits.  Work and work Statistician.  Access to firearms.  Method of birth control.  Menstrual cycle.  Pregnancy history. What immunizations do I need? Vaccines are usually given at various ages, according to a schedule. Your health care provider will recommend vaccines for you based on your age, medical history, and lifestyle or other factors, such as travel or where you work.   What tests do I need? Blood tests  Lipid and cholesterol levels. These may be checked every 5 years, or more often if you are over 3 years old.  Hepatitis C test.  Hepatitis B test. Screening  Lung cancer screening. You may have this screening every year starting at age 73 if you have a 30-pack-year history of smoking and currently smoke or have quit within the past 15 years.  Colorectal cancer  screening. ? All adults should have this screening starting at age 52 and continuing until age 17. ? Your health care provider may recommend screening at age 49 if you are at increased risk. ? You will have tests every 1-10 years, depending on your results and the type of screening test.  Diabetes screening. ? This is done by checking your blood sugar (glucose) after you have not eaten for a while (fasting). ? You may have this done every 1-3 years.  Mammogram. ? This may be done every 1-2 years. ? Talk with your health care provider about when you should start having regular mammograms. This may depend on whether you have a family history of breast cancer.  BRCA-related cancer screening. This may be done if you have a family history of breast, ovarian, tubal, or peritoneal cancers.  Pelvic exam and Pap test. ? This may be done every 3 years starting at age 10. ? Starting at age 11, this may be done every 5 years if you have a Pap test in combination with an HPV test. Other tests  STD (sexually transmitted disease) testing, if you are at risk.  Bone density scan. This is done to screen for osteoporosis. You may have this scan if you are at high risk for osteoporosis. Talk with your health care provider about your test results, treatment options, and if necessary, the need for more tests. Follow these instructions at home: Eating and drinking  Eat a diet that includes fresh fruits and vegetables, whole grains, lean protein, and low-fat dairy products.  Take vitamin and mineral supplements  as recommended by your health care provider.  Do not drink alcohol if: ? Your health care provider tells you not to drink. ? You are pregnant, may be pregnant, or are planning to become pregnant.  If you drink alcohol: ? Limit how much you have to 0-1 drink a day. ? Be aware of how much alcohol is in your drink. In the [place] one drink equals one 12 oz bottle of beer (355 mL), one 5 oz glass of  wine (148 mL), or one 1 oz glass of hard liquor (44 mL).   Lifestyle  Take daily care of your teeth and gums. Brush your teeth every morning and night with fluoride toothpaste. Floss one time each day.  Stay active. Exercise for at least 30 minutes 5 or more days each week.  Do not use any products that contain nicotine or tobacco, such as cigarettes, e-cigarettes, and chewing tobacco. If you need help quitting, ask your health care provider.  Do not use drugs.  If you are sexually active, practice safe sex. Use a condom or other form of protection to prevent STIs (sexually transmitted infections).  If you do not wish to become pregnant, use a form of birth control. If you plan to become pregnant, see your health care provider for a prepregnancy visit.  If told by your health care provider, take low-dose aspirin daily starting at age 50.  Find healthy ways to cope with stress, such as: ? Meditation, yoga, or listening to music. ? Journaling. ? Talking to a trusted person. ? Spending time with friends and family. Safety  Always wear your seat belt while driving or riding in a vehicle.  Do not drive: ? If you have been drinking alcohol. Do not ride with someone who has been drinking. ? When you are tired or distracted. ? While texting.  Wear a helmet and other protective equipment during sports activities.  If you have firearms in your house, make sure you follow all gun safety procedures. What's next?  Visit your health care provider once a year for an annual wellness visit.  Ask your health care provider how often you should have your eyes and teeth checked.  Stay up to date on all vaccines. This information is not intended to replace advice given to you by your health care provider. Make sure you discuss any questions you have with your health care provider. Document Revised: 01/27/2020 Document Reviewed: 01/03/2018 Elsevier Patient Education  2021 Elsevier Inc.  

## 2020-10-08 NOTE — Progress Notes (Signed)
Subjective:     Cindy Todd is a 45 y.o. female and is here for a comprehensive physical exam. The patient reports no problems.  Pt has been doing well.  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  Pt's husband and daughter are doing well.  Pt is fully vaccinated and boosted against COVID.  Social History   Socioeconomic History  . Marital status: Married    Spouse name: Not on file  . Number of children: 1  . Years of education: Not on file  . Highest education level: Not on file  Occupational History  . Not on file  Tobacco Use  . Smoking status: Never Smoker  . Smokeless tobacco: Never Used  Substance and Sexual Activity  . Alcohol use: No  . Drug use: No  . Sexual activity: Not on file  Other Topics Concern  . Not on file  Social History Narrative  . Not on file   Social Determinants of Health   Financial Resource Strain: Not on file  Food Insecurity: Not on file  Transportation Needs: Not on file  Physical Activity: Not on file  Stress: Not on file  Social Connections: Not on file  Intimate Partner Violence: Not on file   Health Maintenance  Topic Date Due  . HIV Screening  Never done  . Hepatitis C Screening  Never done  . COVID-19 Vaccine (3 - Booster for Pfizer series) 01/02/2020  . COLONOSCOPY (Pts 45-62yrs Insurance coverage will need to be confirmed)  Never done  . INFLUENZA VACCINE  12/06/2020  . PAP SMEAR-Modifier  10/30/2022  . Zoster Vaccines- Shingrix (1 of 2) 10/06/2025  . TETANUS/TDAP  08/31/2029  . Pneumococcal Vaccine 44-68 Years old  Aged Out  . HPV VACCINES  Aged Out    The following portions of the patient's history were reviewed and updated as appropriate: allergies, current medications, past family history, past medical history, past social history, past surgical history and problem list.  Review of Systems A comprehensive review of systems was negative.   Objective:    BP 102/66 (BP Location:  Left Arm, Patient Position: Sitting, Cuff Size: Normal)   Pulse 90   Temp 98 F (36.7 C) (Oral)   Ht 5\' 6"  (1.676 m)   Wt 139 lb (63 kg)   SpO2 95%   BMI 22.44 kg/m  General appearance: alert, cooperative and no distress Head: Normocephalic, without obvious abnormality, atraumatic Eyes: conjunctivae/corneas clear. PERRL, EOM's intact. Fundi benign. Ears: normal TM's and external ear canals both ears Nose: Nares normal. Septum midline. Mucosa normal. No drainage or sinus tenderness. Throat: lips, mucosa, and tongue normal; teeth and gums normal Neck: no adenopathy, no carotid bruit, no JVD, supple, symmetrical, trachea midline and thyroid not enlarged, symmetric, no tenderness/mass/nodules Lungs: clear to auscultation bilaterally Heart: regular rate and rhythm, S1, S2 normal, no murmur, click, rub or gallop Abdomen: soft, non-tender; bowel sounds normal; no masses,  no organomegaly Extremities: extremities normal, atraumatic, no cyanosis or edema Pulses: 2+ and symmetric Skin: Skin color, texture, turgor normal. No rashes or lesions Lymph nodes: Cervical, supraclavicular, and axillary nodes normal. Neurologic: Alert and oriented X 3, normal strength and tone. Normal symmetric reflexes. Normal coordination and gait    Assessment:    Healthy female exam.     Plan:     Anticipatory guidance given including wearing seatbelts, smoke detectors in the home, increasing physical activity, increasing p.o. intake of water and vegetables. -  will obtain labs -pt to schedule mammogram -colonoscopy referral placed -pap up to date -given handout -next CPE in 1 yr See After Visit Summary for Counseling Recommendations    Screening for cholesterol level -lifestyle modifications  - Plan: Lipid panel  Colon cancer screening  - Plan: Ambulatory referral to Gastroenterology  Encounter for hepatitis C screening test for low risk patient  - Plan: Hep C Antibody  F/u prn  Abbe Amsterdam, MD

## 2020-10-11 LAB — BASIC METABOLIC PANEL
BUN: 15 mg/dL (ref 6–23)
CO2: 26 mEq/L (ref 19–32)
Calcium: 9.2 mg/dL (ref 8.4–10.5)
Chloride: 103 mEq/L (ref 96–112)
Creatinine, Ser: 0.58 mg/dL (ref 0.40–1.20)
GFR: 109.6 mL/min (ref >60.00)
Glucose, Bld: 79 mg/dL (ref 70–99)
Potassium: 3.9 mEq/L (ref 3.5–5.1)
Sodium: 138 mEq/L (ref 135–145)

## 2020-10-11 LAB — LIPID PANEL
Cholesterol: 167 mg/dL (ref 0–200)
HDL: 61.1 mg/dL (ref >39.00)
LDL Cholesterol: 92 mg/dL (ref 0–99)
NonHDL: 105.5
Total CHOL/HDL Ratio: 3
Triglycerides: 68 mg/dL (ref 0.0–149.0)
VLDL: 13.6 mg/dL (ref 0.0–40.0)

## 2020-10-11 LAB — HEPATITIS C ANTIBODY
Hepatitis C Ab: NONREACTIVE
SIGNAL TO CUT-OFF: 0.01 (ref <1.00)

## 2020-10-14 NOTE — Progress Notes (Signed)
Released results to MyChart.

## 2021-02-10 ENCOUNTER — Telehealth: Payer: No Typology Code available for payment source | Admitting: Physician Assistant

## 2021-02-10 DIAGNOSIS — J019 Acute sinusitis, unspecified: Secondary | ICD-10-CM

## 2021-02-10 DIAGNOSIS — B9689 Other specified bacterial agents as the cause of diseases classified elsewhere: Secondary | ICD-10-CM

## 2021-02-10 MED ORDER — TRIAMCINOLONE ACETONIDE 0.1 % EX CREA: 1.0000 "application " | TOPICAL_CREAM | Freq: Two times a day (BID) | CUTANEOUS | 0 refills | Status: DC

## 2021-02-10 MED ORDER — AMOXICILLIN-POT CLAVULANATE 875-125 MG PO TABS: 1.0000 | ORAL_TABLET | Freq: Two times a day (BID) | ORAL | 0 refills | Status: DC

## 2021-02-10 NOTE — Addendum Note (Signed)
Addended by: Waldon Merl on: 02/10/2021 07:28 AM   Modules accepted: Orders

## 2021-02-10 NOTE — Progress Notes (Addendum)

## 2021-02-10 NOTE — Progress Notes (Signed)
I have spent 5 minutes in review of e-visit questionnaire, review and updating patient chart, medical decision making and response to patient.   Kassem Kibbe Cody Yanira Tolsma, PA-C    

## 2021-02-10 NOTE — Progress Notes (Deleted)
E-Visit for Eczema  We are sorry that you are not feeling well. Here is how we plan to help! Based on what you shared with me it looks like you have eczema (atopic dermatitis) -- of the areas on your extremities and neck. The areas on the face seem more like seborrheic dermatitis which is basically an oily and flaky skin condition with some underlying redness.     Although the cause of eczema is not completely understood, genetics appear to play a strong role, and people with a family history of eczema are at increased risk of developing the condition. In most people with eczema, there is a genetic abnormality in the outermost layer of the skin, called the epidermis   Most people with eczema develop their first symptoms as children, before the age of 10. Intense itching of the skin, patches of redness, small bumps, and skin flaking are common. Scratching can further inflame the skin and worsen the itching. The itchiness may be more noticeable at nighttime.  Eczema commonly affects the back of the neck, the elbow creases, and the backs of the knees. Other affected areas may include the face, wrists, and forearms. The skin may become thickened and darkened, or even scarred, from repeated scratching. Eliminating factors that aggravate your eczema symptoms can help to control the symptoms. Possible triggers may include: ? Cold or dry environments ? Sweating ? Emotional stress or anxiety ? Rapid temperature changes ? Exposure to certain chemicals or cleaning solutions, including soaps and detergents, perfumes and cosmetics, wool or synthetic fibers, dust, sand, and cigarette smoke Keeping your skin hydrated Emollients -- Emollients are creams and ointments that moisturize the skin and prevent it from drying out. The best emollients for people with eczema are thick creams (such as Eucerin, Cetaphil, and Nutraderm) or ointments (such as petroleum jelly, Aquaphor, and Vaseline), which contain little to  no water. Emollients are most effective when applied immediately after bathing. Emollients can be applied twice a day or more often if needed. Lotions contain more water than creams and ointments and are less effective for moisturizing the skin. Bathing -- It is not clear if showers or baths are better for keeping the skin hydrated. Lukewarm baths or showers can hydrate and cool the skin, temporarily relieving itching from eczema. An unscented, mild soap or non-soap cleanser (such as Cetaphil) should be used sparingly. Apply an emollient immediately after bathing or showering to prevent your skin from drying out as a result of water evaporation. Emollient bath additives (products you add to the bath water) have not been found to help relieve symptoms. Hot or long baths (more than 10 to 15 minutes) and showers should be avoided since they can dry out the skin.  Based on what you shared with me you may have eczema.   Triamcinalone ointment (or cream). Apply to the effected areas twice per day. This can be applied everywhere but the face.   For the seborrhea component -- keep skin clean and dry. Consider applying an anti-dandruff shampoo to these areas once daily -- letting sit and then rinsing well. Pat dry. Be very careful around the eyelids -- oftentimes we will recommend a baby shampoo like Laural Benes and [place] for this area. Avoid applying any of the steroid cream to the face/eyelids. If this is not calming downs with the anti-dandruff shampoo, please contact your PCP for additional treatments.   I recommend dilute bleach baths for people with eczema. These baths help to decrease the number of  bacteria on the skin that can cause infections or worsen symptoms. To prepare a bleach bath, one-fourth to one-half cup of bleach is placed in a full bathtub (about 40 gallons) of water. Bleach baths are usually taken for 5 to 10 minutes twice per week and should be followed by application of an emollient (listed  above). I recommend you take Benadryl 25mg  - 50mg  every 4 hours to control the symptoms (including itching) but if they last over 24 hours it is best that you see an office based provider for follow up.  HOME CARE: Take lukewarm showers or baths Apply creams and ointments to prevent the skin from drying (Eucerin, Cetaphil, Nutraderm, petroleum jelly, Aquaphor or Vaseline) - these products contain less water than other lotions and are more effective for moisturizing the skin Limit exposure to cold or dry environments, sweating, emotional stress and anxiety, rapid temperature changes and exposure to chemicals and cleaning products, soaps and detergents, perfumes, cosmetics, wool and synthetic fibers, dust, sand and cigarette- factors which can aggravate eczema symptoms.  Use a hydrocortisone cream once or twice a day Take an antihistamine like Benadryl for widespread rashes that itch.  The adult dosage of Benadryl is 25-50 mg by mouth 4 times daily. Caution: This type of medication may cause sleepiness.  Do not drink alcohol, drive, or operate dangerous machinery while taking antihistamines.  Do not take these medications if you have prostate enlargement.  Read the package instructions thoroughly on all medications that you take.  GET HELP RIGHT AWAY IF: Symptoms that don't go away after treatment. Severe itching that persists. You develop a fever. Your skin begins to drain. You have a sore throat. You become short of breath.  MAKE SURE YOU   Understand these instructions. Will watch your condition. Will get help right away if you are not doing well or get worse.    Thank you for choosing an e-visit.  Your e-visit answers were reviewed by a board certified advanced clinical practitioner to complete your personal care plan. Depending upon the condition, your plan could have included both over the counter or prescription medications.  Please review your pharmacy choice. Make sure the  pharmacy is open so you can pick up prescription now. If there is a problem, you may contact your provider through and have the prescription routed to another pharmacy.  Your safety is important to . If you have drug allergies check your prescription carefully.   For the next 24 hours you can use MyChart to ask questions about today's visit, request a non-urgent call back, or ask for a work or school excuse. You will get an email in the next two days asking about your experience. I hope that your e-visit has been valuable and will speed your recovery.

## 2021-02-15 IMAGING — MG DIGITAL SCREENING BILAT W/ TOMO W/ CAD
6 of 10 series · 6 of 30 positions shown · non-contrast
Comparison: Previous exam(s).

CLINICAL DATA: Screening.

EXAM:
DIGITAL SCREENING BILATERAL MAMMOGRAM WITH TOMO AND CAD

[L MLO synth-2D (1 of 2)]
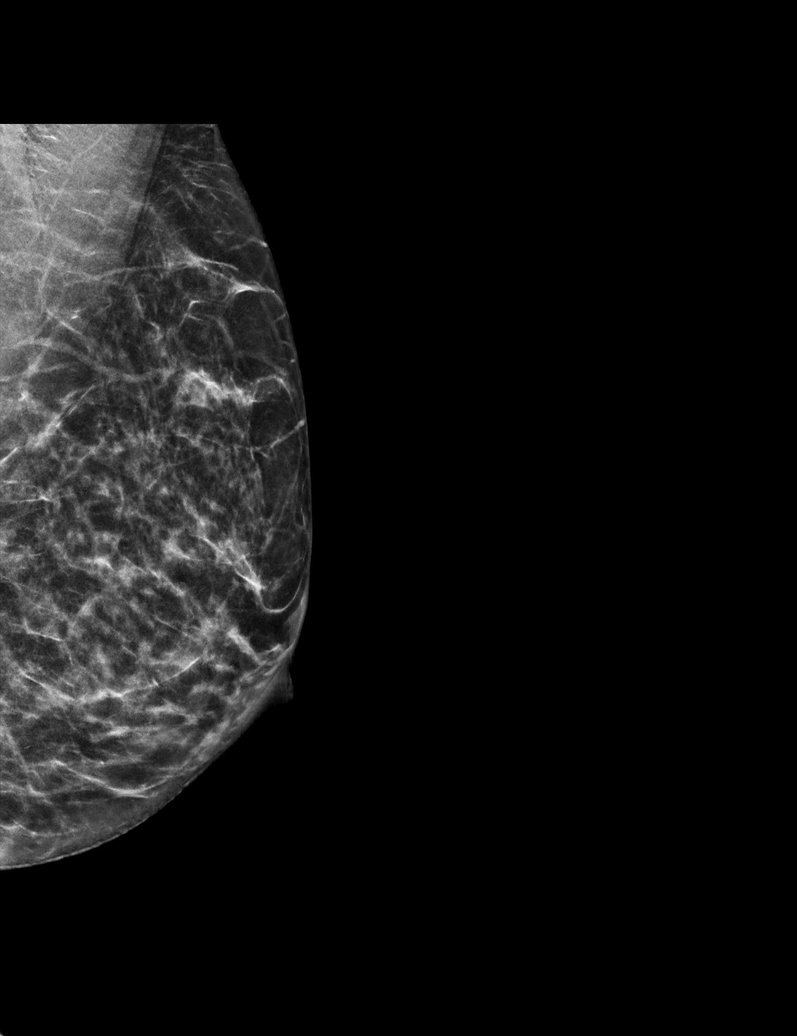

[L MLO synth-2D (2 of 2)]
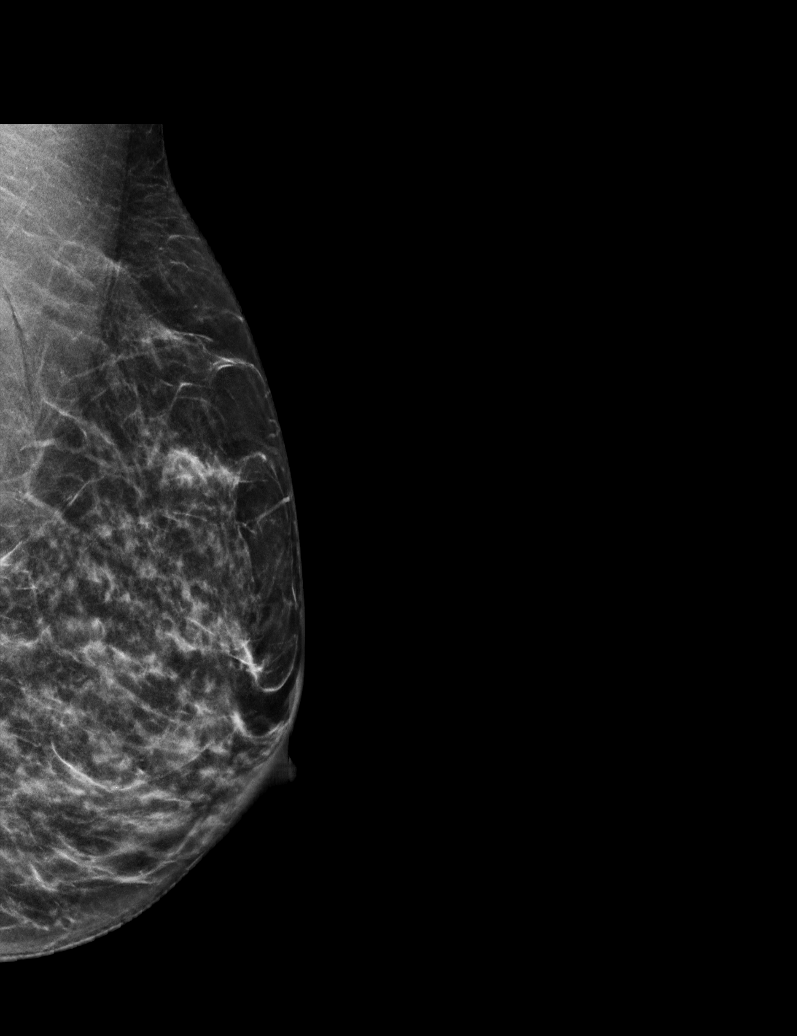

[L CC synth-2D]
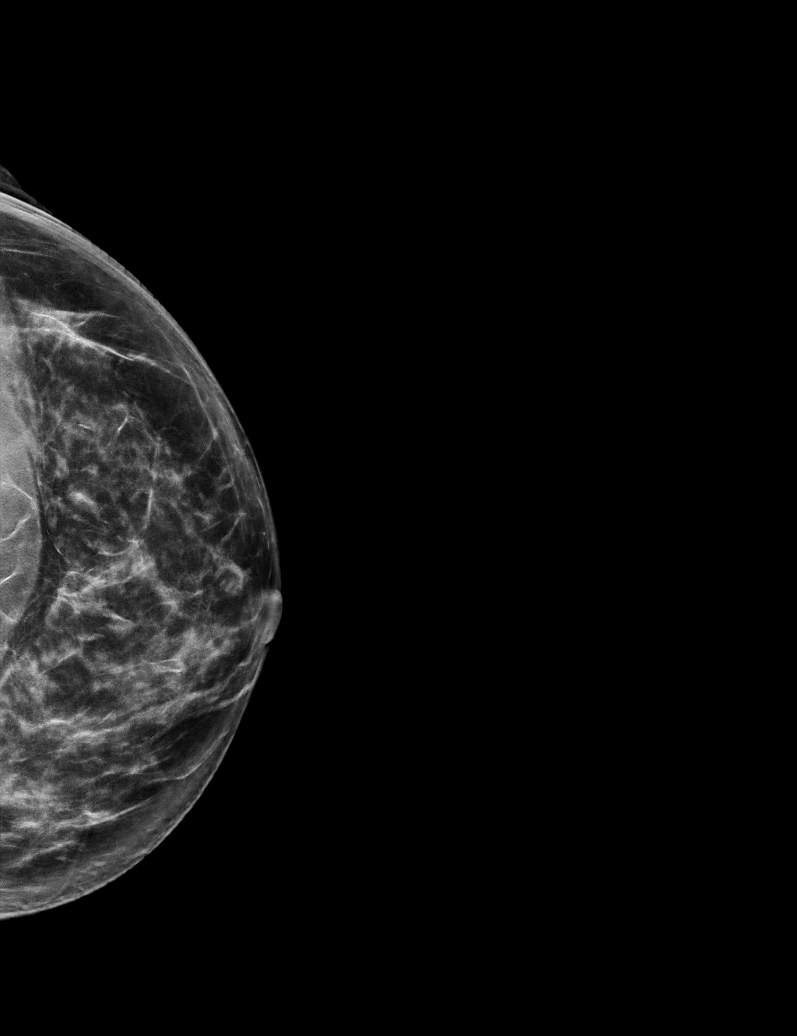

[R CC synth-2D]
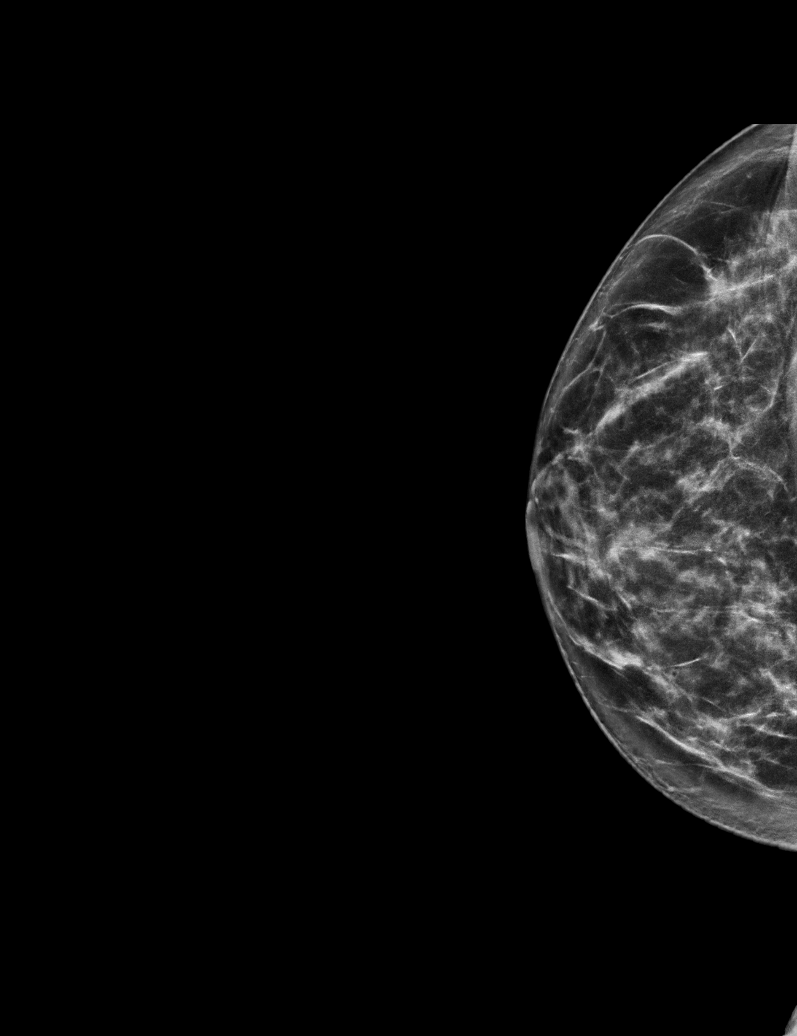

[R MLO synth-2D]
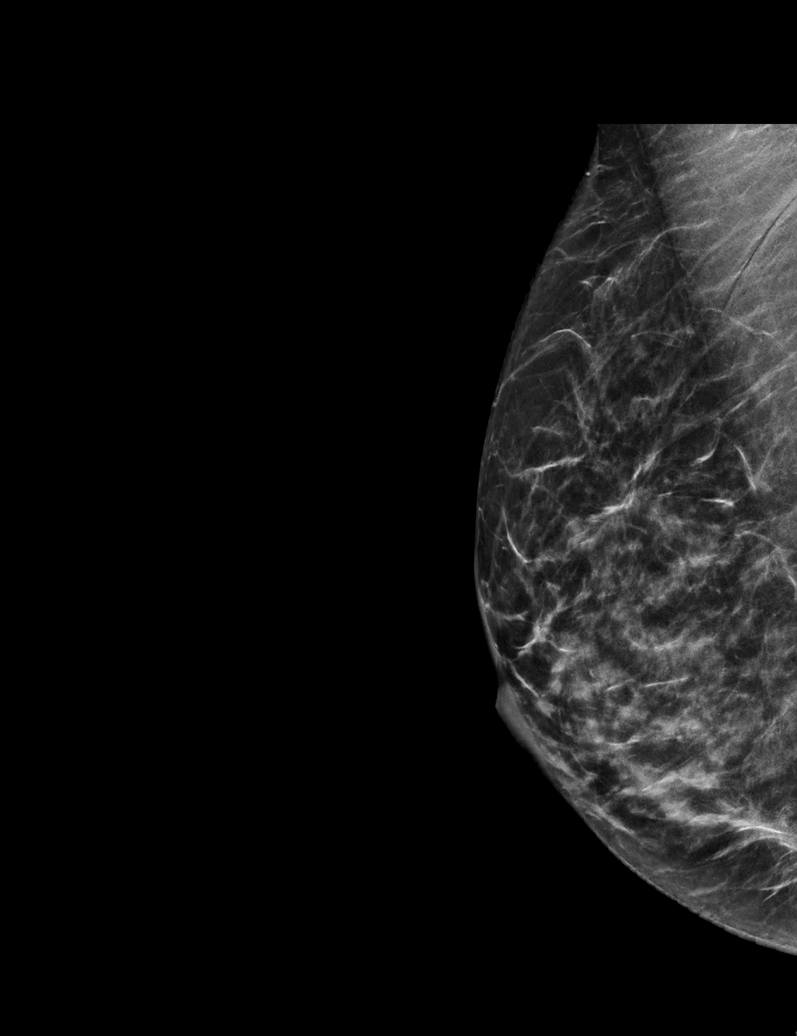

[L CC tomo · tomo slice 35/68.0]
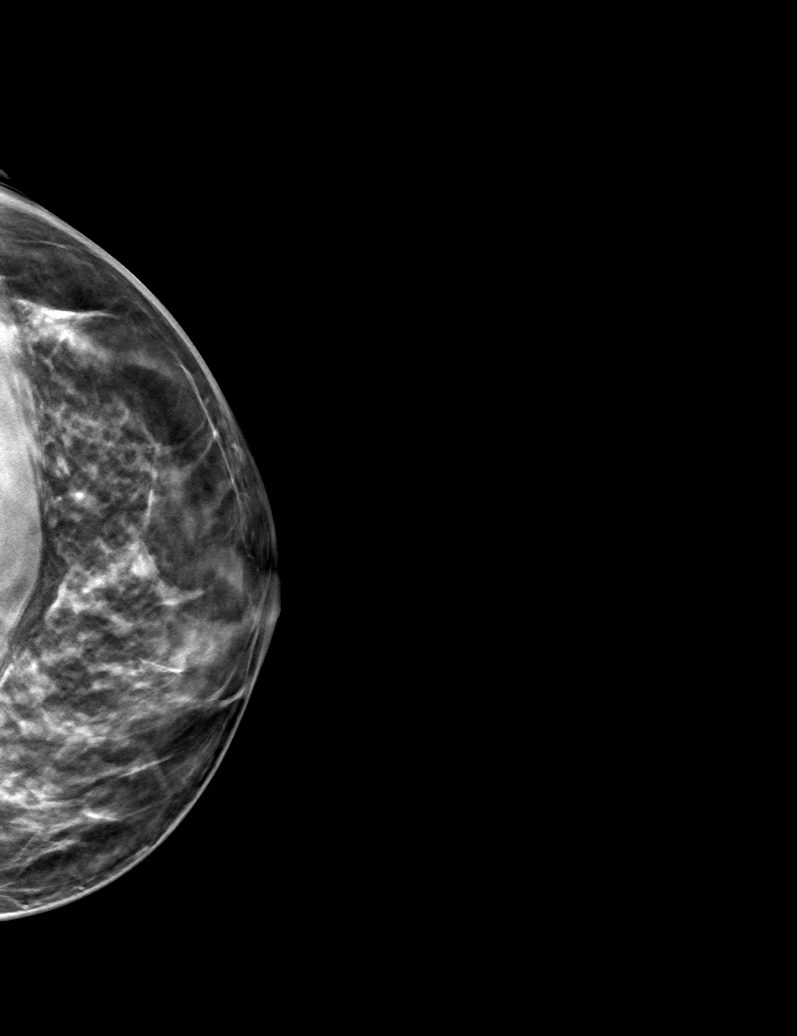

[6 of 30 positions shown; findings below may reference images not displayed]

ACR Breast Density Category c: The breast tissue is heterogeneously
dense, which may obscure small masses.
FINDINGS: There are no findings suspicious for malignancy. Images were
processed with CAD.
IMPRESSION: No mammographic evidence of malignancy. A result letter of this
screening mammogram will be mailed directly to the patient.

RECOMMENDATION:
Screening mammogram in one year. (Code:[id number]-U-LHB)

BI-RADS CATEGORY  1: Negative.

## 2021-03-24 ENCOUNTER — Encounter: Payer: Self-pay | Admitting: Family Medicine

## 2021-11-02 ENCOUNTER — Ambulatory Visit (INDEPENDENT_AMBULATORY_CARE_PROVIDER_SITE_OTHER): Payer: No Typology Code available for payment source | Admitting: Family Medicine

## 2021-11-02 VITALS — BP 100/70 | HR 73 | Temp 98.1°F | Ht 66.0 in | Wt 139.8 lb

## 2021-11-02 DIAGNOSIS — Z1211 Encounter for screening for malignant neoplasm of colon: Secondary | ICD-10-CM

## 2021-11-02 DIAGNOSIS — Z0000 Encounter for general adult medical examination without abnormal findings: Secondary | ICD-10-CM | POA: Diagnosis not present

## 2021-11-02 LAB — LIPID PANEL
Cholesterol: 150 mg/dL (ref 0–200)
HDL: 59.3 mg/dL (ref >39.00)
LDL Cholesterol: 81 mg/dL (ref 0–99)
NonHDL: 90.65
Total CHOL/HDL Ratio: 3
Triglycerides: 47 mg/dL (ref 0.0–149.0)
VLDL: 9.4 mg/dL (ref 0.0–40.0)

## 2021-11-02 LAB — CBC WITH DIFFERENTIAL/PLATELET
Basophils Absolute: 0 10*3/uL (ref 0.0–0.1)
Basophils Relative: 0.3 % (ref 0.0–3.0)
Eosinophils Absolute: 0.1 10*3/uL (ref 0.0–0.7)
Eosinophils Relative: 3.1 % (ref 0.0–5.0)
HCT: 36.7 % (ref 36.0–46.0)
Hemoglobin: 12.1 g/dL (ref 12.0–15.0)
Lymphocytes Relative: 31 % (ref 12.0–46.0)
Lymphs Abs: 1.5 10*3/uL (ref 0.7–4.0)
MCHC: 32.9 g/dL (ref 30.0–36.0)
MCV: 86.1 fl (ref 78.0–100.0)
Monocytes Absolute: 0.5 10*3/uL (ref 0.1–1.0)
Monocytes Relative: 9.8 % (ref 3.0–12.0)
Neutro Abs: 2.7 10*3/uL (ref 1.4–7.7)
Neutrophils Relative %: 55.8 % (ref 43.0–77.0)
Platelets: 234 10*3/uL (ref 150.0–400.0)
RBC: 4.27 Mil/uL (ref 3.87–5.11)
RDW: 13.4 % (ref 11.5–15.5)
WBC: 4.8 10*3/uL (ref 4.0–10.5)

## 2021-11-02 LAB — T4, FREE: Free T4: 0.87 ng/dL (ref 0.60–1.60)

## 2021-11-02 LAB — BASIC METABOLIC PANEL
BUN: 14 mg/dL (ref 6–23)
CO2: 30 mEq/L (ref 19–32)
Calcium: 9.6 mg/dL (ref 8.4–10.5)
Chloride: 102 mEq/L (ref 96–112)
Creatinine, Ser: 0.64 mg/dL (ref 0.40–1.20)
GFR: 106.24 mL/min (ref >60.00)
Glucose, Bld: 81 mg/dL (ref 70–99)
Potassium: 3.9 mEq/L (ref 3.5–5.1)
Sodium: 138 mEq/L (ref 135–145)

## 2021-11-02 LAB — TSH: TSH: 0.99 u[IU]/mL (ref 0.35–5.50)

## 2021-11-02 LAB — HEMOGLOBIN A1C: Hgb A1c MFr Bld: 5.9 % (ref 4.6–6.5)

## 2021-11-02 NOTE — Progress Notes (Signed)
Subjective:     Cindy Todd is a 46 y.o. female and is here for a comprehensive physical exam. The patient reports no problems.  Patient is accompanied by her 40-year-old daughter Zollie Scale.  Patient states she is doing well overall.  Plans to visit her family in [place] this week.  Patient states she had COVID-19 in January after visiting her family in [place].  Patient plans to schedule mammogram  Social History   Socioeconomic History   Marital status: Married    Spouse name: Not on file   Number of children: 1   Years of education: Not on file   Highest education level: Not on file  Occupational History   Not on file  Tobacco Use   Smoking status: Never   Smokeless tobacco: Never  Substance and Sexual Activity   Alcohol use: No   Drug use: No   Sexual activity: Not on file  Other Topics Concern   Not on file  Social History Narrative   Not on file   Social Determinants of Health   Financial Resource Strain: Not on file  Food Insecurity: Not on file  Transportation Needs: Not on file  Physical Activity: Not on file  Stress: Not on file  Social Connections: Not on file  Intimate Partner Violence: Not on file   Health Maintenance  Topic Date Due   HIV Screening  Never done   COVID-19 Vaccine (3 - Pfizer series) 09/27/2019   COLONOSCOPY (Pts 45-56yrs Insurance coverage will need to be confirmed)  Never done   INFLUENZA VACCINE  12/06/2021   PAP SMEAR-Modifier  10/30/2022   TETANUS/TDAP  08/31/2029   Hepatitis C Screening  Completed   HPV VACCINES  Aged Out    The following portions of the patient's history were reviewed and updated as appropriate: allergies, current medications, past family history, past medical history, past social history, past surgical history, and problem list.  Review of Systems Pertinent items noted in HPI and remainder of comprehensive ROS otherwise negative.   Objective:    BP 100/70 (BP Location: Left Arm, Patient Position:  Sitting, Cuff Size: Normal)   Pulse 73   Temp 98.1 F (36.7 C) (Oral)   Ht 5\' 6"  (1.676 m)   Wt 139 lb 12.8 oz (63.4 kg)   LMP 10/21/2021 (Exact Date)   SpO2 97%   BMI 22.56 kg/m  General appearance: alert, cooperative, and no distress Head: Normocephalic, without obvious abnormality, atraumatic Eyes: conjunctivae/corneas clear. PERRL, EOM's intact. Fundi benign. Ears: normal TM's and external ear canals both ears Nose: Nares normal. Septum midline. Mucosa normal. No drainage or sinus tenderness. Throat: lips, mucosa, and tongue normal; teeth and gums normal Neck: no adenopathy, no carotid bruit, no JVD, supple, symmetrical, trachea midline, and thyroid not enlarged, symmetric, no tenderness/mass/nodules Lungs: clear to auscultation bilaterally Heart: regular rate and rhythm, S1, S2 normal, no murmur, click, rub or gallop Abdomen: soft, non-tender; bowel sounds normal; no masses,  no organomegaly Extremities: extremities normal, atraumatic, no cyanosis or edema Pulses: 2+ and symmetric Skin: Skin color, texture, turgor normal. No rashes or lesions Lymph nodes: Cervical, supraclavicular, and axillary nodes normal. Neurologic: Alert and oriented X 3, normal strength and tone. Normal symmetric reflexes. Normal coordination and gait    Assessment:    Healthy female exam.      Plan:    Anticipatory guidance given including wearing seatbelts, smoke detectors in the home, increasing physical activity, increasing p.o. intake of water and vegetables. -labs -  Patient to schedule mammogram -Colonoscopy due.  Referral placed -Pap up-to-date done 11/03/2019 -Immunizations reviewed -Next CPE in 1 year See After Visit Summary for Counseling Recommendations   Colon cancer screening -Plan: Referral to gastroenterology  Follow-up as needed  Abbe Amsterdam, MD

## 2021-12-26 ENCOUNTER — Other Ambulatory Visit: Payer: Self-pay | Admitting: Family Medicine

## 2021-12-26 DIAGNOSIS — Z1231 Encounter for screening mammogram for malignant neoplasm of breast: Secondary | ICD-10-CM

## 2021-12-29 ENCOUNTER — Ambulatory Visit (INDEPENDENT_AMBULATORY_CARE_PROVIDER_SITE_OTHER): Payer: No Typology Code available for payment source | Admitting: Family Medicine

## 2021-12-29 VITALS — BP 118/62 | HR 78 | Temp 98.2°F | Wt 131.8 lb

## 2021-12-29 DIAGNOSIS — J40 Bronchitis, not specified as acute or chronic: Secondary | ICD-10-CM

## 2021-12-29 MED ORDER — AMOXICILLIN 500 MG PO TABS: 500.0000 mg | ORAL_TABLET | Freq: Two times a day (BID) | ORAL | 0 refills | Status: AC

## 2021-12-29 NOTE — Progress Notes (Signed)
Subjective:    Patient ID: Cindy Todd, female    DOB: 01-16-1976, 46 y.o.   MRN: [id number]  Chief Complaint  Patient presents with   Cough    For more than 2 wks, started getting bad 2 wks ago. Has not taken anything otc for it. When it is productive, it is green. No sinus issues before it happened. One morning, she woke up with dryness on her nose. But states she does not feel bad.     HPI Patient was seen today for ongoing concern.  Patient endorses cough x 2-3 wks.  Pt notes increase in phelgm, now green.  Denies headache, facial pain/pressure, ear pain/pressure, rhinorrhea, sore throat, nausea, vomiting.  Patient states her daughter and husband was sick after traveling from [place] but her symptoms did not last as long.  Patient has not tried anything for her symptoms.  No past medical history on file.  No Known Allergies  ROS General: Denies fever, chills, night sweats, changes in weight, changes in appetite HEENT: Denies headaches, ear pain, changes in vision, rhinorrhea, sore throat CV: Denies CP, palpitations, SOB, orthopnea Pulm: Denies SOB, wheezing +cough GI: Denies abdominal pain, nausea, vomiting, diarrhea, constipation GU: Denies dysuria, hematuria, frequency, vaginal discharge Msk: Denies muscle cramps, joint pains Neuro: Denies weakness, numbness, tingling Skin: Denies rashes, bruising Psych: Denies depression, anxiety, hallucinations      Objective:    Blood pressure 118/62, pulse 78, temperature 98.2 F (36.8 C), temperature source Oral, weight 131 lb 12.8 oz (59.8 kg), SpO2 95 %.   Gen. Pleasant, well-nourished, in no distress, normal affect   HEENT: [place]/AT, face symmetric, conjunctiva clear, no scleral icterus, PERRLA, EOMI, nares patent without drainage, no TTP of sinuses, pharynx with mild t erythema, no exudate.  TMs full bilaterally.  No cervical lymphadenopathy. Lungs: Cough, no accessory muscle use, CTAB, no wheezes or  rales Cardiovascular: RRR, no m/r/g, no peripheral edema Neuro:  A&Ox3, CN II-XII intact, normal gait Skin:  Warm, no lesions/ rash   Wt Readings from Last 3 Encounters:  12/29/21 131 lb 12.8 oz (59.8 kg)  11/02/21 139 lb 12.8 oz (63.4 kg)  10/08/20 139 lb (63 kg)    Lab Results  Component Value Date   WBC 4.8 11/02/2021   HGB 12.1 11/02/2021   HCT 36.7 11/02/2021   PLT 234.0 11/02/2021   GLUCOSE 81 11/02/2021   CHOL 150 11/02/2021   TRIG 47.0 11/02/2021   HDL 59.30 11/02/2021   LDLCALC 81 11/02/2021   ALT 13 09/01/2019   AST 14 09/01/2019   NA 138 11/02/2021   K 3.9 11/02/2021   CL 102 11/02/2021   CREATININE 0.64 11/02/2021   BUN 14 11/02/2021   CO2 30 11/02/2021   TSH 0.99 11/02/2021   HGBA1C 5.9 11/02/2021    Assessment/Plan:  Bronchitis -Continue supportive care -Okay to use OTC antihistamine/cough medication as needed. - Plan: amoxicillin (AMOXIL) 500 MG tablet  F/u prn  Abbe Amsterdam, MD

## 2022-01-11 ENCOUNTER — Ambulatory Visit: Payer: No Typology Code available for payment source

## 2022-01-18 ENCOUNTER — Ambulatory Visit
Admission: RE | Admit: 2022-01-18 | Discharge: 2022-01-18 | Disposition: A | Discharge status: Home / Self Care | Payer: No Typology Code available for payment source | Source: Ambulatory Visit | Attending: Family Medicine | Admitting: Family Medicine

## 2022-01-18 DIAGNOSIS — Z1231 Encounter for screening mammogram for malignant neoplasm of breast: Secondary | ICD-10-CM

## 2022-06-20 DIAGNOSIS — K648 Other hemorrhoids: Secondary | ICD-10-CM | POA: Diagnosis not present

## 2022-06-20 DIAGNOSIS — Z1211 Encounter for screening for malignant neoplasm of colon: Secondary | ICD-10-CM | POA: Diagnosis not present

## 2022-06-20 LAB — HM COLONOSCOPY

## 2023-01-03 ENCOUNTER — Ambulatory Visit (INDEPENDENT_AMBULATORY_CARE_PROVIDER_SITE_OTHER): Payer: 59 | Admitting: Family Medicine

## 2023-01-03 ENCOUNTER — Other Ambulatory Visit (HOSPITAL_COMMUNITY)
Admission: RE | Admit: 2023-01-03 | Discharge: 2023-01-03 | Disposition: A | Discharge status: Home / Self Care | Payer: 59 | Source: Ambulatory Visit | Attending: Family Medicine | Admitting: Family Medicine

## 2023-01-03 ENCOUNTER — Encounter: Payer: Self-pay | Admitting: Family Medicine

## 2023-01-03 VITALS — BP 98/60 | HR 70 | Temp 98.5°F | Ht 66.0 in | Wt 131.0 lb

## 2023-01-03 DIAGNOSIS — Z124 Encounter for screening for malignant neoplasm of cervix: Secondary | ICD-10-CM | POA: Insufficient documentation

## 2023-01-03 DIAGNOSIS — Z23 Encounter for immunization: Secondary | ICD-10-CM | POA: Diagnosis not present

## 2023-01-03 DIAGNOSIS — Z0000 Encounter for general adult medical examination without abnormal findings: Secondary | ICD-10-CM

## 2023-01-03 DIAGNOSIS — Z711 Person with feared health complaint in whom no diagnosis is made: Secondary | ICD-10-CM

## 2023-01-03 LAB — CBC WITH DIFFERENTIAL/PLATELET
Basophils Absolute: 0 10*3/uL (ref 0.0–0.1)
Basophils Relative: 0.3 % (ref 0.0–3.0)
Eosinophils Absolute: 0.1 10*3/uL (ref 0.0–0.7)
Eosinophils Relative: 2.8 % (ref 0.0–5.0)
HCT: 34.9 % — ABNORMAL LOW (ref 36.0–46.0)
Hemoglobin: 11.4 g/dL — ABNORMAL LOW (ref 12.0–15.0)
Lymphocytes Relative: 29.7 % (ref 12.0–46.0)
Lymphs Abs: 1.5 10*3/uL (ref 0.7–4.0)
MCHC: 32.6 g/dL (ref 30.0–36.0)
MCV: 86.4 fl (ref 78.0–100.0)
Monocytes Absolute: 0.4 10*3/uL (ref 0.1–1.0)
Monocytes Relative: 7.6 % (ref 3.0–12.0)
Neutro Abs: 3 10*3/uL (ref 1.4–7.7)
Neutrophils Relative %: 59.6 % (ref 43.0–77.0)
Platelets: 193 10*3/uL (ref 150.0–400.0)
RBC: 4.04 Mil/uL (ref 3.87–5.11)
RDW: 13.3 % (ref 11.5–15.5)
WBC: 5 10*3/uL (ref 4.0–10.5)

## 2023-01-03 LAB — COMPREHENSIVE METABOLIC PANEL
ALT: 11 U/L (ref 0–35)
AST: 14 U/L (ref 0–37)
Albumin: 4.2 g/dL (ref 3.5–5.2)
Alkaline Phosphatase: 48 U/L (ref 39–117)
BUN: 13 mg/dL (ref 6–23)
CO2: 30 meq/L (ref 19–32)
Calcium: 9.4 mg/dL (ref 8.4–10.5)
Chloride: 101 meq/L (ref 96–112)
Creatinine, Ser: 0.56 mg/dL (ref 0.40–1.20)
GFR: 108.82 mL/min (ref >60.00)
Glucose, Bld: 83 mg/dL (ref 70–99)
Potassium: 3.6 meq/L (ref 3.5–5.1)
Sodium: 139 meq/L (ref 135–145)
Total Bilirubin: 0.4 mg/dL (ref 0.2–1.2)
Total Protein: 7.2 g/dL (ref 6.0–8.3)

## 2023-01-03 LAB — POCT URINALYSIS DIPSTICK
Bilirubin, UA: NEGATIVE
Blood, UA: NEGATIVE
Glucose, UA: NEGATIVE
Ketones, UA: NEGATIVE
Nitrite, UA: NEGATIVE
Protein, UA: NEGATIVE
Spec Grav, UA: 1.015 (ref 1.010–1.025)
Urobilinogen, UA: NEGATIVE E.U./dL — AB
pH, UA: 6 (ref 5.0–8.0)

## 2023-01-03 LAB — LIPID PANEL
Cholesterol: 163 mg/dL (ref 0–200)
HDL: 60.2 mg/dL (ref >39.00)
LDL Cholesterol: 93 mg/dL (ref 0–99)
NonHDL: 102.84
Total CHOL/HDL Ratio: 3
Triglycerides: 48 mg/dL (ref 0.0–149.0)
VLDL: 9.6 mg/dL (ref 0.0–40.0)

## 2023-01-03 LAB — HEMOGLOBIN A1C: Hgb A1c MFr Bld: 6 % (ref 4.6–6.5)

## 2023-01-03 NOTE — Addendum Note (Signed)
Addended by: Deeann Saint on: 01/03/2023 03:04 PM   Modules accepted: Orders

## 2023-01-03 NOTE — Progress Notes (Addendum)
Established Patient Office Visit   Subjective  Patient ID: Cindy Todd, female    DOB: Sep 08, 1975  Age: 47 y.o. MRN: [id number]  Chief Complaint  Patient presents with   Annual Exam    Patient is a 47 year old female seen for CPE.  Patient states she has been doing well overall.  Had a UDS for a job today and was told she may have an infection.  Patient denies dysuria, frequency, hematuria, hesitancy, decreased volume.  Patient had a colonoscopy done 06/20/2022.  Would like to do Pap every 3 years.  Last done 10/14/2019.  Patient notes perimenopausal symptoms as menses becoming irregular.  LMP May 2024.  Patient interested in influenza vaccine.    History reviewed. No pertinent past medical history. History reviewed. No pertinent surgical history. Social History   Tobacco Use   Smoking status: Never   Smokeless tobacco: Never  Substance Use Topics   Alcohol use: No   Drug use: No   Family History  Problem Relation Age of Onset   Hypertension Mother    Heart disease Father    Breast cancer Sister 42   Breast cancer Maternal Aunt 40   No Known Allergies    ROS Negative unless stated above    Objective:     BP 98/60 (BP Location: Right Arm, Patient Position: Sitting, Cuff Size: Normal)   Pulse 70   Temp 98.5 F (36.9 C) (Oral)   Ht 5\' 6"  (1.676 m)   Wt 131 lb (59.4 kg)   SpO2 97%   BMI 21.14 kg/m    Physical Exam Constitutional:      Appearance: Normal appearance.  HENT:     Head: Normocephalic and atraumatic.     Right Ear: Tympanic membrane, ear canal and external ear normal.     Left Ear: Tympanic membrane, ear canal and external ear normal.     Nose: Nose normal.     Mouth/Throat:     Mouth: Mucous membranes are moist.     Pharynx: No oropharyngeal exudate or posterior oropharyngeal erythema.  Eyes:     General: No scleral icterus.    Extraocular Movements: Extraocular movements intact.     Conjunctiva/sclera: Conjunctivae normal.      Pupils: Pupils are equal, round, and reactive to light.  Neck:     Thyroid: No thyromegaly.  Cardiovascular:     Rate and Rhythm: Normal rate and regular rhythm.     Pulses: Normal pulses.     Heart sounds: Normal heart sounds. No murmur heard.    No friction rub.  Pulmonary:     Effort: Pulmonary effort is normal.     Breath sounds: Normal breath sounds. No wheezing, rhonchi or rales.  Abdominal:     General: Bowel sounds are normal.     Palpations: Abdomen is soft.     Tenderness: There is no abdominal tenderness.  Genitourinary:    General: Normal vulva.     Labia:        Right: No rash or lesion.        Left: No rash or lesion.      Urethra: No prolapse or urethral swelling.     Vagina: Normal.     Cervix: Normal.     Uterus: Normal.      Adnexa: Right adnexa normal and left adnexa normal.     Rectum: Normal.  Musculoskeletal:        General: No deformity. Normal range of motion.  Lymphadenopathy:  Cervical: No cervical adenopathy.  Skin:    General: Skin is warm and dry.     Findings: No lesion.  Neurological:     General: No focal deficit present.     Mental Status: She is alert and oriented to person, place, and time.  Psychiatric:        Mood and Affect: Mood normal.        Thought Content: Thought content normal.       01/03/2023    1:52 PM 12/29/2021    3:44 PM 11/02/2021   10:35 AM 10/08/2020    1:10 PM 07/06/2017    1:41 PM  Depression screen PHQ 2/9  Decreased Interest 0 0 1 0 0  Down, Depressed, Hopeless 0 0 1 0 0  PHQ - 2 Score 0 0 2 0 0  Altered sleeping 0 1 0 0   Tired, decreased energy 0 0 1 0   Change in appetite 0 0 0 0   Feeling bad or failure about yourself  0 0 0 0   Trouble concentrating 0 0 1 0   Moving slowly or fidgety/restless 0 0 0 0   Suicidal thoughts 0 0 0 0   PHQ-9 Score 0 1 4 0   Difficult doing work/chores  Not difficult at all Not difficult at all        01/03/2023    1:52 PM 10/08/2020    1:11 PM  GAD 7 : Generalized  Anxiety Score  Nervous, Anxious, on Edge 0 0  Control/stop worrying 0 0  Worry too much - different things 0 0  Trouble relaxing 0 0  Restless 0 0  Easily annoyed or irritable 0 0  Afraid - awful might happen 0 0  Total GAD 7 Score 0 0     Results for orders placed or performed in visit on 01/03/23  POCT urinalysis dipstick  Result Value Ref Range   Color, UA light yellow    Clarity, UA clear    Glucose, UA Negative Negative   Bilirubin, UA neg    Ketones, UA neg    Spec Grav, UA 1.015 1.010 - 1.025   Blood, UA neg    pH, UA 6.0 5.0 - 8.0   Protein, UA Negative Negative   Urobilinogen, UA negative (A) 0.2 or 1.0 E.U./dL   Nitrite, UA neg    Leukocytes, UA Trace (A) Negative   Appearance     Odor        Assessment & Plan:  Well adult exam -     Comprehensive metabolic panel -     CBC with Differential/Platelet -     TSH -     T4, free -     Hemoglobin A1c -     Lipid panel -     POCT urinalysis dipstick  Cervical cancer screening -     Cytology - PAP  Concern about urinary tract disease without diagnosis -     POCT urinalysis dipstick  Need for influenza vaccination -     Flu vaccine trivalent PF, 6mos and older(Flulaval,Afluria,Fluarix,Fluzone)  Age-appropriate health screenings discussed.  Papoosed visit.  Will obtain labs.  Colonoscopy done 06/20/2022.  Mammogram to be done next month.  Influenza vaccine given this visit.  UA with trace leuks.  Return if symptoms worsen or fail to improve.   Deeann Saint, MD

## 2023-01-04 LAB — TSH: TSH: 1.01 u[IU]/mL (ref 0.35–5.50)

## 2023-01-04 LAB — T4, FREE: Free T4: 0.76 ng/dL (ref 0.60–1.60)

## 2023-01-05 LAB — URINE CULTURE
MICRO NUMBER:: 15393947
SPECIMEN QUALITY:: ADEQUATE

## 2023-01-11 LAB — CYTOLOGY - PAP
Comment: NEGATIVE
Diagnosis: NEGATIVE
High risk HPV: NEGATIVE

## 2023-01-12 ENCOUNTER — Other Ambulatory Visit: Payer: Self-pay | Admitting: Family Medicine

## 2023-01-12 DIAGNOSIS — B9620 Unspecified Escherichia coli [E. coli] as the cause of diseases classified elsewhere: Secondary | ICD-10-CM

## 2023-01-12 MED ORDER — NITROFURANTOIN MONOHYD MACRO 100 MG PO CAPS
100.0000 mg | ORAL_CAPSULE | Freq: Two times a day (BID) | ORAL | 0 refills | Status: AC
Start: 2023-01-12 — End: 2023-01-19

## 2023-03-16 ENCOUNTER — Other Ambulatory Visit: Payer: Self-pay | Admitting: Family Medicine

## 2023-03-16 DIAGNOSIS — Z1231 Encounter for screening mammogram for malignant neoplasm of breast: Secondary | ICD-10-CM

## 2023-04-13 ENCOUNTER — Ambulatory Visit
Admission: RE | Admit: 2023-04-13 | Discharge: 2023-04-13 | Disposition: A | Discharge status: Home / Self Care | Payer: 59 | Source: Ambulatory Visit | Attending: Family Medicine

## 2023-04-13 DIAGNOSIS — Z1231 Encounter for screening mammogram for malignant neoplasm of breast: Secondary | ICD-10-CM

## 2024-01-04 ENCOUNTER — Ambulatory Visit (INDEPENDENT_AMBULATORY_CARE_PROVIDER_SITE_OTHER): Admitting: Family Medicine

## 2024-01-04 ENCOUNTER — Encounter: Payer: Self-pay | Admitting: Family Medicine

## 2024-01-04 VITALS — BP 100/70 | HR 67 | Temp 97.9°F | Ht 66.0 in | Wt 141.5 lb

## 2024-01-04 DIAGNOSIS — R7303 Prediabetes: Secondary | ICD-10-CM

## 2024-01-04 DIAGNOSIS — M65331 Trigger finger, right middle finger: Secondary | ICD-10-CM | POA: Diagnosis not present

## 2024-01-04 DIAGNOSIS — Z0000 Encounter for general adult medical examination without abnormal findings: Secondary | ICD-10-CM

## 2024-01-04 NOTE — Progress Notes (Addendum)
 Established Patient Office Visit   Subjective  Patient ID: Cindy Todd, female    DOB: 10/13/1975  Age: 48 y.o. MRN: [id number]  Chief Complaint  Patient presents with   Annual Exam    Pt is a 48 yo female seen for CPE.  Pt is not fasting this visit, had a granola bar around lunch.  Doing well overall.  Staying busy with work and with her daughter's activities.  Pt would like influenza vaccine prior to school starting.  Pt is a Manufacturing systems engineer.  Pt inquires about arthritis.  States middle finger of R hand gets stuck at times with flexion.  Not overly bothersome, but new.    There are no active problems to display for this patient.  History reviewed. No pertinent past medical history. History reviewed. No pertinent surgical history. Social History   Tobacco Use   Smoking status: Never   Smokeless tobacco: Never  Substance Use Topics   Alcohol use: No   Drug use: No   Family History  Problem Relation Age of Onset   Hypertension Mother    Heart disease Father    Breast cancer Sister 67   Breast cancer Maternal Aunt 40   Breast cancer Maternal Aunt 60 - 69   No Known Allergies  ROS Negative unless stated above    Objective:     BP 100/70   Pulse 67   Temp 97.9 F (36.6 C) (Oral)   Ht 5' 6 (1.676 m)   Wt 141 lb 8 oz (64.2 kg)   SpO2 97%   BMI 22.84 kg/m  BP Readings from Last 3 Encounters:  01/04/24 100/70  01/03/23 98/60  12/29/21 118/62   Wt Readings from Last 3 Encounters:  01/04/24 141 lb 8 oz (64.2 kg)  01/03/23 131 lb (59.4 kg)  12/29/21 131 lb 12.8 oz (59.8 kg)      Physical Exam Constitutional:      Appearance: Normal appearance.  HENT:     Head: Normocephalic and atraumatic.     Right Ear: Tympanic membrane, ear canal and external ear normal.     Left Ear: Tympanic membrane, ear canal and external ear normal.     Nose: Nose normal.     Mouth/Throat:     Mouth: Mucous membranes are moist.     Pharynx: No oropharyngeal  exudate or posterior oropharyngeal erythema.  Eyes:     General: No scleral icterus.    Extraocular Movements: Extraocular movements intact.     Conjunctiva/sclera: Conjunctivae normal.     Pupils: Pupils are equal, round, and reactive to light.  Neck:     Thyroid : No thyromegaly.     Vascular: No carotid bruit.  Cardiovascular:     Rate and Rhythm: Normal rate and regular rhythm.     Pulses: Normal pulses.     Heart sounds: Normal heart sounds. No murmur heard.    No friction rub.  Pulmonary:     Effort: Pulmonary effort is normal.     Breath sounds: Normal breath sounds. No wheezing, rhonchi or rales.  Abdominal:     General: Bowel sounds are normal.     Palpations: Abdomen is soft.     Tenderness: There is no abdominal tenderness.  Musculoskeletal:        General: No deformity. Normal range of motion.     Comments: Trigger finger R 3rd digit.  Lymphadenopathy:     Cervical: No cervical adenopathy.  Skin:    General: Skin is  warm and dry.     Findings: No lesion.  Neurological:     General: No focal deficit present.     Mental Status: She is alert and oriented to person, place, and time.  Psychiatric:        Mood and Affect: Mood normal.        Thought Content: Thought content normal.        01/04/2024    1:12 PM 01/03/2023    1:52 PM 12/29/2021    3:44 PM  Depression screen PHQ 2/9  Decreased Interest 0 0 0  Down, Depressed, Hopeless 0 0 0  PHQ - 2 Score 0 0 0  Altered sleeping  0 1  Tired, decreased energy  0 0  Change in appetite  0 0  Feeling bad or failure about yourself   0 0  Trouble concentrating  0 0  Moving slowly or fidgety/restless  0 0  Suicidal thoughts  0 0  PHQ-9 Score  0 1  Difficult doing work/chores   Not difficult at all      01/03/2023    1:52 PM 10/08/2020    1:11 PM  GAD 7 : Generalized Anxiety Score  Nervous, Anxious, on Edge 0 0  Control/stop worrying 0 0  Worry too much - different things 0 0  Trouble relaxing 0 0  Restless 0 0   Easily annoyed or irritable 0 0  Afraid - awful might happen 0 0  Total GAD 7 Score 0 0     No results found for any visits on 01/04/24.    Assessment & Plan:   Well adult exam -     Comprehensive metabolic panel with GFR; Future -     TSH; Future -     T4, free; Future -     Hemoglobin A1c; Future -     CBC with Differential/Platelet; Future -     Lipid panel; Future  Prediabetes  Trigger middle finger of right hand  Age appropriate health screenings discussed.  Obtain labs.  Immunizations reviewed.  Consider flu vaccine when available or have at local pharmacy.  Mammogram done 04/13/23.  Colonoscopy done 06/20/22, repeat in 10 yrs, 2034.  Pap done 01/11/23.  Continue lifestyle modifications. Last A1C was 6.0% on 01/03/23.  Discussed treatment options for trigger finger including steroid inj and surgery.  Pt declines at this time.  Return in about 1 year (around 01/03/2025).   Cindy Todd Single, MD

## 2024-01-05 LAB — COMPREHENSIVE METABOLIC PANEL WITH GFR
AG Ratio: 1.7 (calc) (ref 1.0–2.5)
ALT: 12 U/L (ref 6–29)
AST: 16 U/L (ref 10–35)
Albumin: 4.7 g/dL (ref 3.6–5.1)
Alkaline phosphatase (APISO): 53 U/L (ref 31–125)
BUN: 15 mg/dL (ref 7–25)
CO2: 31 mmol/L (ref 20–32)
Calcium: 9.6 mg/dL (ref 8.6–10.2)
Chloride: 103 mmol/L (ref 98–110)
Creat: 0.56 mg/dL (ref 0.50–0.99)
Globulin: 2.8 g/dL (ref 1.9–3.7)
Glucose, Bld: 91 mg/dL (ref 65–99)
Potassium: 3.7 mmol/L (ref 3.5–5.3)
Sodium: 140 mmol/L (ref 135–146)
Total Bilirubin: 0.3 mg/dL (ref 0.2–1.2)
Total Protein: 7.5 g/dL (ref 6.1–8.1)
eGFR: 113 mL/min/1.73m2 (ref >=60)

## 2024-01-05 LAB — CBC WITH DIFFERENTIAL/PLATELET
Absolute Lymphocytes: 1530 {cells}/uL (ref 850–3900)
Absolute Monocytes: 312 {cells}/uL (ref 200–950)
Basophils Absolute: 21 {cells}/uL (ref 0–200)
Basophils Relative: 0.6 %
Eosinophils Absolute: 182 {cells}/uL (ref 15–500)
Eosinophils Relative: 5.2 %
HCT: 36.3 % (ref 35.0–45.0)
Hemoglobin: 12 g/dL (ref 11.7–15.5)
MCH: 29.1 pg (ref 27.0–33.0)
MCHC: 33.1 g/dL (ref 32.0–36.0)
MCV: 87.9 fL (ref 80.0–100.0)
MPV: 10.5 fL (ref 7.5–12.5)
Monocytes Relative: 8.9 %
Neutro Abs: 1456 {cells}/uL — ABNORMAL LOW (ref 1500–7800)
Neutrophils Relative %: 41.6 %
Platelets: 215 Thousand/uL (ref 140–400)
RBC: 4.13 Million/uL (ref 3.80–5.10)
RDW: 13 % (ref 11.0–15.0)
Total Lymphocyte: 43.7 %
WBC: 3.5 Thousand/uL — ABNORMAL LOW (ref 3.8–10.8)

## 2024-01-05 LAB — LIPID PANEL
Cholesterol: 161 mg/dL (ref <200)
HDL: 60 mg/dL (ref >=50)
LDL Cholesterol (Calc): 88 mg/dL
Non-HDL Cholesterol (Calc): 101 mg/dL (ref <130)
Total CHOL/HDL Ratio: 2.7 (calc) (ref <5.0)
Triglycerides: 50 mg/dL (ref <150)

## 2024-01-05 LAB — T4, FREE: Free T4: 1.2 ng/dL (ref 0.8–1.8)

## 2024-01-05 LAB — HEMOGLOBIN A1C
Hgb A1c MFr Bld: 6.2 % — ABNORMAL HIGH (ref <5.7)
Mean Plasma Glucose: 131 mg/dL
eAG (mmol/L): 7.3 mmol/L

## 2024-01-05 LAB — TSH: TSH: 1.01 m[IU]/L

## 2024-01-10 ENCOUNTER — Ambulatory Visit: Payer: Self-pay | Admitting: Family Medicine

## 2024-01-14 NOTE — Telephone Encounter (Signed)
 Forms have been filled out and faxed to health wellness program,

## 2024-05-28 ENCOUNTER — Other Ambulatory Visit: Payer: Self-pay | Admitting: Family Medicine

## 2024-05-28 DIAGNOSIS — Z1231 Encounter for screening mammogram for malignant neoplasm of breast: Secondary | ICD-10-CM

## 2024-06-10 ENCOUNTER — Ambulatory Visit
Admission: RE | Admit: 2024-06-10 | Discharge: 2024-06-10 | Disposition: A | Source: Ambulatory Visit | Attending: Family Medicine

## 2024-06-10 DIAGNOSIS — Z1231 Encounter for screening mammogram for malignant neoplasm of breast: Secondary | ICD-10-CM
